# Patient Record
Sex: Female | Born: 1935
Health system: Southern US, Community
[De-identification: ages and names within clinical notes are randomized; demographics above are authoritative.]

## PROBLEM LIST (undated history)

## (undated) DIAGNOSIS — I4891 Unspecified atrial fibrillation: Secondary | ICD-10-CM

## (undated) DIAGNOSIS — I499 Cardiac arrhythmia, unspecified: Secondary | ICD-10-CM

## (undated) HISTORY — DX: Unspecified atrial fibrillation: I48.91

## (undated) HISTORY — DX: Cardiac arrhythmia, unspecified: I49.9

## (undated) HISTORY — PX: VARICOSE VEIN SURGERY: SHX832

## (undated) HISTORY — PX: ABDOMINAL HYSTERECTOMY: SHX81

---

## 1998-03-20 ENCOUNTER — Ambulatory Visit (HOSPITAL_BASED_OUTPATIENT_CLINIC_OR_DEPARTMENT_OTHER): Admission: RE | Admit: 1998-03-20 | Discharge: 1998-03-20 | Payer: Self-pay | Admitting: *Deleted

## 2015-03-22 ENCOUNTER — Other Ambulatory Visit: Payer: Self-pay | Admitting: Internal Medicine

## 2015-03-22 DIAGNOSIS — Z1231 Encounter for screening mammogram for malignant neoplasm of breast: Secondary | ICD-10-CM

## 2015-10-02 ENCOUNTER — Ambulatory Visit: Payer: Self-pay

## 2015-11-01 DIAGNOSIS — Z7901 Long term (current) use of anticoagulants: Secondary | ICD-10-CM | POA: Diagnosis not present

## 2015-11-30 DIAGNOSIS — J0100 Acute maxillary sinusitis, unspecified: Secondary | ICD-10-CM | POA: Diagnosis not present

## 2015-12-03 DIAGNOSIS — K2100 Gastro-esophageal reflux disease with esophagitis, without bleeding: Secondary | ICD-10-CM

## 2015-12-03 DIAGNOSIS — E7800 Pure hypercholesterolemia, unspecified: Secondary | ICD-10-CM

## 2015-12-03 DIAGNOSIS — M150 Primary generalized (osteo)arthritis: Secondary | ICD-10-CM

## 2015-12-03 DIAGNOSIS — M159 Polyosteoarthritis, unspecified: Secondary | ICD-10-CM

## 2015-12-03 DIAGNOSIS — J3089 Other allergic rhinitis: Secondary | ICD-10-CM | POA: Insufficient documentation

## 2015-12-03 DIAGNOSIS — I10 Essential (primary) hypertension: Secondary | ICD-10-CM | POA: Insufficient documentation

## 2015-12-03 DIAGNOSIS — Z7901 Long term (current) use of anticoagulants: Secondary | ICD-10-CM | POA: Insufficient documentation

## 2015-12-03 DIAGNOSIS — E039 Hypothyroidism, unspecified: Secondary | ICD-10-CM

## 2015-12-03 DIAGNOSIS — M810 Age-related osteoporosis without current pathological fracture: Secondary | ICD-10-CM

## 2015-12-03 DIAGNOSIS — I480 Paroxysmal atrial fibrillation: Secondary | ICD-10-CM | POA: Insufficient documentation

## 2015-12-03 DIAGNOSIS — M8589 Other specified disorders of bone density and structure, multiple sites: Secondary | ICD-10-CM

## 2015-12-03 DIAGNOSIS — M8949 Other hypertrophic osteoarthropathy, multiple sites: Secondary | ICD-10-CM

## 2015-12-03 HISTORY — DX: Gastro-esophageal reflux disease with esophagitis, without bleeding: K21.00

## 2015-12-03 HISTORY — DX: Essential (primary) hypertension: I10

## 2015-12-03 HISTORY — DX: Other hypertrophic osteoarthropathy, multiple sites: M89.49

## 2015-12-03 HISTORY — DX: Polyosteoarthritis, unspecified: M15.9

## 2015-12-03 HISTORY — DX: Age-related osteoporosis without current pathological fracture: M81.0

## 2015-12-03 HISTORY — DX: Pure hypercholesterolemia, unspecified: E78.00

## 2015-12-03 HISTORY — DX: Hypothyroidism, unspecified: E03.9

## 2015-12-03 HISTORY — DX: Paroxysmal atrial fibrillation: I48.0

## 2015-12-03 HISTORY — DX: Long term (current) use of anticoagulants: Z79.01

## 2015-12-03 HISTORY — DX: Other allergic rhinitis: J30.89

## 2015-12-03 HISTORY — DX: Other specified disorders of bone density and structure, multiple sites: M85.89

## 2015-12-03 HISTORY — DX: Primary generalized (osteo)arthritis: M15.0

## 2015-12-11 DIAGNOSIS — Z7901 Long term (current) use of anticoagulants: Secondary | ICD-10-CM | POA: Diagnosis not present

## 2016-01-01 DIAGNOSIS — I480 Paroxysmal atrial fibrillation: Secondary | ICD-10-CM | POA: Diagnosis not present

## 2016-01-01 DIAGNOSIS — N390 Urinary tract infection, site not specified: Secondary | ICD-10-CM | POA: Diagnosis not present

## 2016-01-01 DIAGNOSIS — I10 Essential (primary) hypertension: Secondary | ICD-10-CM | POA: Diagnosis not present

## 2016-01-10 DIAGNOSIS — Z7901 Long term (current) use of anticoagulants: Secondary | ICD-10-CM | POA: Diagnosis not present

## 2016-02-12 DIAGNOSIS — Z7901 Long term (current) use of anticoagulants: Secondary | ICD-10-CM | POA: Diagnosis not present

## 2016-03-19 DIAGNOSIS — I480 Paroxysmal atrial fibrillation: Secondary | ICD-10-CM | POA: Diagnosis not present

## 2016-03-19 DIAGNOSIS — Z5181 Encounter for therapeutic drug level monitoring: Secondary | ICD-10-CM | POA: Diagnosis not present

## 2016-03-19 DIAGNOSIS — E7800 Pure hypercholesterolemia, unspecified: Secondary | ICD-10-CM | POA: Diagnosis not present

## 2016-03-19 DIAGNOSIS — J3089 Other allergic rhinitis: Secondary | ICD-10-CM | POA: Diagnosis not present

## 2016-03-19 DIAGNOSIS — I10 Essential (primary) hypertension: Secondary | ICD-10-CM | POA: Diagnosis not present

## 2016-03-19 DIAGNOSIS — K210 Gastro-esophageal reflux disease with esophagitis: Secondary | ICD-10-CM | POA: Diagnosis not present

## 2016-03-19 DIAGNOSIS — R0600 Dyspnea, unspecified: Secondary | ICD-10-CM

## 2016-03-19 DIAGNOSIS — E039 Hypothyroidism, unspecified: Secondary | ICD-10-CM | POA: Diagnosis not present

## 2016-03-19 DIAGNOSIS — R0609 Other forms of dyspnea: Secondary | ICD-10-CM

## 2016-03-19 DIAGNOSIS — Z0000 Encounter for general adult medical examination without abnormal findings: Secondary | ICD-10-CM | POA: Diagnosis not present

## 2016-03-19 DIAGNOSIS — M8589 Other specified disorders of bone density and structure, multiple sites: Secondary | ICD-10-CM | POA: Diagnosis not present

## 2016-03-19 DIAGNOSIS — N3941 Urge incontinence: Secondary | ICD-10-CM

## 2016-03-19 DIAGNOSIS — Z7901 Long term (current) use of anticoagulants: Secondary | ICD-10-CM | POA: Diagnosis not present

## 2016-03-19 DIAGNOSIS — M150 Primary generalized (osteo)arthritis: Secondary | ICD-10-CM | POA: Diagnosis not present

## 2016-03-19 HISTORY — DX: Dyspnea, unspecified: R06.00

## 2016-03-19 HISTORY — DX: Other forms of dyspnea: R06.09

## 2016-03-19 HISTORY — DX: Urge incontinence: N39.41

## 2016-03-24 DIAGNOSIS — Z1231 Encounter for screening mammogram for malignant neoplasm of breast: Secondary | ICD-10-CM | POA: Diagnosis not present

## 2016-03-29 DIAGNOSIS — R0609 Other forms of dyspnea: Secondary | ICD-10-CM | POA: Diagnosis not present

## 2016-03-29 DIAGNOSIS — R0602 Shortness of breath: Secondary | ICD-10-CM | POA: Diagnosis not present

## 2016-04-21 DIAGNOSIS — I480 Paroxysmal atrial fibrillation: Secondary | ICD-10-CM | POA: Diagnosis not present

## 2016-04-21 DIAGNOSIS — I10 Essential (primary) hypertension: Secondary | ICD-10-CM | POA: Diagnosis not present

## 2016-04-21 DIAGNOSIS — R0609 Other forms of dyspnea: Secondary | ICD-10-CM | POA: Diagnosis not present

## 2016-04-21 DIAGNOSIS — Z7901 Long term (current) use of anticoagulants: Secondary | ICD-10-CM | POA: Diagnosis not present

## 2016-04-23 DIAGNOSIS — I10 Essential (primary) hypertension: Secondary | ICD-10-CM | POA: Diagnosis not present

## 2016-04-23 DIAGNOSIS — R0609 Other forms of dyspnea: Secondary | ICD-10-CM | POA: Diagnosis not present

## 2016-04-23 DIAGNOSIS — I480 Paroxysmal atrial fibrillation: Secondary | ICD-10-CM | POA: Diagnosis not present

## 2016-05-28 DIAGNOSIS — I10 Essential (primary) hypertension: Secondary | ICD-10-CM | POA: Diagnosis not present

## 2016-05-28 DIAGNOSIS — R0609 Other forms of dyspnea: Secondary | ICD-10-CM | POA: Diagnosis not present

## 2016-05-28 DIAGNOSIS — I480 Paroxysmal atrial fibrillation: Secondary | ICD-10-CM | POA: Diagnosis not present

## 2016-05-29 DIAGNOSIS — Z7901 Long term (current) use of anticoagulants: Secondary | ICD-10-CM | POA: Diagnosis not present

## 2016-06-07 DIAGNOSIS — I480 Paroxysmal atrial fibrillation: Secondary | ICD-10-CM | POA: Diagnosis not present

## 2016-06-07 DIAGNOSIS — R079 Chest pain, unspecified: Secondary | ICD-10-CM | POA: Diagnosis not present

## 2016-06-07 DIAGNOSIS — I4891 Unspecified atrial fibrillation: Secondary | ICD-10-CM | POA: Diagnosis not present

## 2016-06-10 DIAGNOSIS — I480 Paroxysmal atrial fibrillation: Secondary | ICD-10-CM | POA: Insufficient documentation

## 2016-06-10 DIAGNOSIS — E039 Hypothyroidism, unspecified: Secondary | ICD-10-CM | POA: Insufficient documentation

## 2016-06-12 DIAGNOSIS — Z87891 Personal history of nicotine dependence: Secondary | ICD-10-CM | POA: Diagnosis not present

## 2016-06-12 DIAGNOSIS — Z79899 Other long term (current) drug therapy: Secondary | ICD-10-CM | POA: Diagnosis not present

## 2016-06-17 DIAGNOSIS — I480 Paroxysmal atrial fibrillation: Secondary | ICD-10-CM | POA: Diagnosis not present

## 2016-06-17 DIAGNOSIS — E039 Hypothyroidism, unspecified: Secondary | ICD-10-CM | POA: Diagnosis not present

## 2016-06-17 DIAGNOSIS — I4891 Unspecified atrial fibrillation: Secondary | ICD-10-CM | POA: Diagnosis not present

## 2016-06-18 DIAGNOSIS — I480 Paroxysmal atrial fibrillation: Secondary | ICD-10-CM | POA: Diagnosis not present

## 2016-06-18 DIAGNOSIS — Z7901 Long term (current) use of anticoagulants: Secondary | ICD-10-CM | POA: Diagnosis not present

## 2016-06-18 DIAGNOSIS — N390 Urinary tract infection, site not specified: Secondary | ICD-10-CM | POA: Diagnosis not present

## 2016-06-18 DIAGNOSIS — E039 Hypothyroidism, unspecified: Secondary | ICD-10-CM | POA: Diagnosis not present

## 2016-06-26 DIAGNOSIS — R002 Palpitations: Secondary | ICD-10-CM | POA: Diagnosis not present

## 2016-06-26 DIAGNOSIS — R079 Chest pain, unspecified: Secondary | ICD-10-CM | POA: Diagnosis not present

## 2016-06-26 DIAGNOSIS — I499 Cardiac arrhythmia, unspecified: Secondary | ICD-10-CM | POA: Diagnosis not present

## 2016-06-26 DIAGNOSIS — I480 Paroxysmal atrial fibrillation: Secondary | ICD-10-CM | POA: Diagnosis not present

## 2016-07-07 DIAGNOSIS — I480 Paroxysmal atrial fibrillation: Secondary | ICD-10-CM | POA: Diagnosis not present

## 2016-07-07 DIAGNOSIS — E039 Hypothyroidism, unspecified: Secondary | ICD-10-CM | POA: Diagnosis not present

## 2016-07-08 DIAGNOSIS — R300 Dysuria: Secondary | ICD-10-CM | POA: Diagnosis not present

## 2016-07-08 DIAGNOSIS — I480 Paroxysmal atrial fibrillation: Secondary | ICD-10-CM | POA: Diagnosis not present

## 2016-07-08 DIAGNOSIS — I10 Essential (primary) hypertension: Secondary | ICD-10-CM | POA: Diagnosis not present

## 2016-07-09 DIAGNOSIS — R300 Dysuria: Secondary | ICD-10-CM | POA: Diagnosis not present

## 2016-08-07 DIAGNOSIS — E039 Hypothyroidism, unspecified: Secondary | ICD-10-CM | POA: Diagnosis not present

## 2016-08-07 DIAGNOSIS — I480 Paroxysmal atrial fibrillation: Secondary | ICD-10-CM | POA: Diagnosis not present

## 2016-08-12 DIAGNOSIS — C44311 Basal cell carcinoma of skin of nose: Secondary | ICD-10-CM | POA: Diagnosis not present

## 2016-08-12 DIAGNOSIS — D485 Neoplasm of uncertain behavior of skin: Secondary | ICD-10-CM | POA: Diagnosis not present

## 2016-08-12 DIAGNOSIS — L570 Actinic keratosis: Secondary | ICD-10-CM | POA: Diagnosis not present

## 2016-08-20 DIAGNOSIS — I480 Paroxysmal atrial fibrillation: Secondary | ICD-10-CM | POA: Diagnosis not present

## 2016-09-24 DIAGNOSIS — C44321 Squamous cell carcinoma of skin of nose: Secondary | ICD-10-CM | POA: Diagnosis not present

## 2016-10-02 DIAGNOSIS — L659 Nonscarring hair loss, unspecified: Secondary | ICD-10-CM

## 2016-10-02 DIAGNOSIS — E039 Hypothyroidism, unspecified: Secondary | ICD-10-CM | POA: Diagnosis not present

## 2016-10-02 DIAGNOSIS — I10 Essential (primary) hypertension: Secondary | ICD-10-CM | POA: Diagnosis not present

## 2016-10-02 DIAGNOSIS — R600 Localized edema: Secondary | ICD-10-CM | POA: Insufficient documentation

## 2016-10-02 DIAGNOSIS — Z7901 Long term (current) use of anticoagulants: Secondary | ICD-10-CM | POA: Diagnosis not present

## 2016-10-02 DIAGNOSIS — I480 Paroxysmal atrial fibrillation: Secondary | ICD-10-CM | POA: Diagnosis not present

## 2016-10-02 HISTORY — DX: Localized edema: R60.0

## 2016-10-02 HISTORY — DX: Nonscarring hair loss, unspecified: L65.9

## 2016-10-27 DIAGNOSIS — H2513 Age-related nuclear cataract, bilateral: Secondary | ICD-10-CM | POA: Diagnosis not present

## 2016-11-12 DIAGNOSIS — H2513 Age-related nuclear cataract, bilateral: Secondary | ICD-10-CM | POA: Diagnosis not present

## 2016-12-09 DIAGNOSIS — E039 Hypothyroidism, unspecified: Secondary | ICD-10-CM | POA: Diagnosis not present

## 2016-12-11 DIAGNOSIS — Z01818 Encounter for other preprocedural examination: Secondary | ICD-10-CM | POA: Diagnosis not present

## 2016-12-17 DIAGNOSIS — H2512 Age-related nuclear cataract, left eye: Secondary | ICD-10-CM | POA: Diagnosis not present

## 2016-12-17 DIAGNOSIS — H2511 Age-related nuclear cataract, right eye: Secondary | ICD-10-CM | POA: Diagnosis not present

## 2016-12-24 DIAGNOSIS — H2512 Age-related nuclear cataract, left eye: Secondary | ICD-10-CM | POA: Diagnosis not present

## 2016-12-30 DIAGNOSIS — H04122 Dry eye syndrome of left lacrimal gland: Secondary | ICD-10-CM | POA: Diagnosis not present

## 2017-02-10 DIAGNOSIS — L578 Other skin changes due to chronic exposure to nonionizing radiation: Secondary | ICD-10-CM | POA: Diagnosis not present

## 2017-02-10 DIAGNOSIS — L821 Other seborrheic keratosis: Secondary | ICD-10-CM | POA: Diagnosis not present

## 2017-02-10 DIAGNOSIS — D485 Neoplasm of uncertain behavior of skin: Secondary | ICD-10-CM | POA: Diagnosis not present

## 2017-04-02 DIAGNOSIS — Z1231 Encounter for screening mammogram for malignant neoplasm of breast: Secondary | ICD-10-CM | POA: Diagnosis not present

## 2017-04-13 DIAGNOSIS — I480 Paroxysmal atrial fibrillation: Secondary | ICD-10-CM | POA: Diagnosis not present

## 2017-04-13 DIAGNOSIS — I10 Essential (primary) hypertension: Secondary | ICD-10-CM | POA: Diagnosis not present

## 2017-04-13 DIAGNOSIS — W57XXXA Bitten or stung by nonvenomous insect and other nonvenomous arthropods, initial encounter: Secondary | ICD-10-CM | POA: Diagnosis not present

## 2017-04-13 DIAGNOSIS — K210 Gastro-esophageal reflux disease with esophagitis: Secondary | ICD-10-CM | POA: Diagnosis not present

## 2017-04-13 DIAGNOSIS — Z7901 Long term (current) use of anticoagulants: Secondary | ICD-10-CM | POA: Diagnosis not present

## 2017-04-13 DIAGNOSIS — R928 Other abnormal and inconclusive findings on diagnostic imaging of breast: Secondary | ICD-10-CM | POA: Diagnosis not present

## 2017-04-13 DIAGNOSIS — E039 Hypothyroidism, unspecified: Secondary | ICD-10-CM | POA: Diagnosis not present

## 2017-04-20 DIAGNOSIS — R922 Inconclusive mammogram: Secondary | ICD-10-CM | POA: Diagnosis not present

## 2017-04-20 DIAGNOSIS — R928 Other abnormal and inconclusive findings on diagnostic imaging of breast: Secondary | ICD-10-CM | POA: Diagnosis not present

## 2017-05-21 DIAGNOSIS — Z961 Presence of intraocular lens: Secondary | ICD-10-CM | POA: Diagnosis not present

## 2017-06-25 ENCOUNTER — Encounter: Payer: Self-pay | Admitting: Cardiology

## 2017-06-25 ENCOUNTER — Ambulatory Visit (INDEPENDENT_AMBULATORY_CARE_PROVIDER_SITE_OTHER): Payer: PPO | Admitting: Cardiology

## 2017-06-25 VITALS — BP 122/60 | HR 72 | Ht 63.0 in | Wt 141.0 lb

## 2017-06-25 DIAGNOSIS — I480 Paroxysmal atrial fibrillation: Secondary | ICD-10-CM | POA: Diagnosis not present

## 2017-06-25 DIAGNOSIS — E039 Hypothyroidism, unspecified: Secondary | ICD-10-CM | POA: Diagnosis not present

## 2017-06-25 DIAGNOSIS — I4891 Unspecified atrial fibrillation: Secondary | ICD-10-CM | POA: Diagnosis not present

## 2017-06-25 NOTE — Progress Notes (Signed)
Cardiology Office Note:    Date:  06/25/2017   ID:  Christine Baker, DOB Feb 04, 1935, MRN [id number]  PCP:  Christine Baker., MD  Cardiologist:  Christine Lindau, MD   Referring MD: No ref. provider found    ASSESSMENT:    1. PAF (paroxysmal atrial fibrillation) ([place])   2. Atrial fibrillation, unspecified type ([place])   3. Acquired hypothyroidism    PLAN:    In order of problems listed above:  1. I discussed with the patient atrial fibrillation, disease process. Management and therapy including rate and rhythm control, anticoagulation benefits and potential risks were discussed extensively with the patient. Patient had multiple questions which were answered to patient's satisfaction. 2. Patient will continue anticoagulation. In view of the fact that she is on amiodarone therapy 50 mg daily she will obtain pulmonary function tests. I will do blood work today including TSH. She tells me that he's been some time since this has been done. We'll send a copy to a primary care physician. She mentions to me that she gets like to evaluate her. 3. She will be seen in follow-up appointment in 6 months or earlier if she has any concerns.Importance of regular exercise stressed.   Medication Adjustments/Labs and Tests Ordered: Current medicines are reviewed at length with the patient today.  Concerns regarding medicines are outlined above.  Orders Placed This Encounter  Procedures  . Basic metabolic panel  . CBC with Differential/Platelet  . TSH  . Hepatic function panel  . Pulmonary function test   No orders of the defined types were placed in this encounter.    History of Present Illness:    Christine Baker is a 82 y.o. female who is being seen today for the evaluation of paroxysmal atrial fibrillation at the request of her primary care physician. Patient has history of approximately fibrillation and denies any problems at this time and takes care of activities of daily living. No chest pain  orthopnea or PND. She leads a sedentary lifestyle and does not exercise on a regular basis. She's not had any issues with palpitations. At the time of my evaluation she is alert awake oriented and in no distress.  Past Medical History:  Diagnosis Date  . A-fib ([place])   . Arrhythmia     History reviewed. No pertinent surgical history.  Current Medications: Current Meds  Medication Sig  . amiodarone (PACERONE) 100 MG tablet Take 50 mg by mouth daily.  . Cholecalciferol (VITAMIN D3) 1000 units CAPS Take 3,000 Units by mouth daily.  Marland Kitchen DIPHENHYDRAMINE-ACETAMINOPHEN PO Take 25 mg by mouth daily as needed.  Marland Kitchen levothyroxine (SYNTHROID, LEVOTHROID) 75 MCG tablet Take 75 mcg by mouth daily before breakfast.  . losartan-hydrochlorothiazide (HYZAAR) 50-12.5 MG tablet Take 0.5 tablets by mouth daily.  . metoprolol tartrate (LOPRESSOR) 25 MG tablet Take 12.5 mg by mouth 2 (two) times daily.  Marland Kitchen omeprazole (PRILOSEC) 20 MG capsule Take 20 mg by mouth daily.  . rivaroxaban (XARELTO) 20 MG TABS tablet Take 20 mg by mouth daily.     Allergies:   Bacitracin; Cefdinir; Erythromycin base; and Tetracycline   Social History   Social History  . Marital status: Married    Spouse name: N/A  . Number of children: N/A  . Years of education: N/A   Social History Main Topics  . Smoking status: Former Smoker    Quit date: 1970  . Smokeless tobacco: Never Used  . Alcohol use No  . Drug use: No  .  Sexual activity: Not Asked   Other Topics Concern  . None   Social History Narrative  . None     Family History: The patient's Family history is unknown by patient.  ROS:   Please see the history of present illness.    All other systems reviewed and are negative.  EKGs/Labs/Other Studies Reviewed:    The following studies were reviewed today: I reviewed records previous visits and also primary care physician's records including blood work.   Recent Labs: No results found for requested labs within  last 8760 hours.  Recent Lipid Panel No results found for: CHOL, TRIG, HDL, CHOLHDL, VLDL, LDLCALC, LDLDIRECT  Physical Exam:    VS:  BP 122/60   Pulse 72   Ht 5\' 3"  (1.6 m)   Wt 141 lb (64 kg)   SpO2 99%   BMI 24.98 kg/m     Wt Readings from Last 3 Encounters:  06/25/17 141 lb (64 kg)     GEN: Patient is in no acute distress HEENT: Normal NECK: No JVD; No carotid bruits LYMPHATICS: No lymphadenopathy CARDIAC: S1 S2 regular, 2/6 systolic murmur at the apex. RESPIRATORY:  Clear to auscultation without rales, wheezing or rhonchi  ABDOMEN: Soft, non-tender, non-distended MUSCULOSKELETAL:  No edema; No deformity  SKIN: Warm and dry NEUROLOGIC:  Alert and oriented x 3 PSYCHIATRIC:  Normal affect    Signed, Christine Lindau, MD  06/25/2017 11:09 AM    [place]

## 2017-06-25 NOTE — Patient Instructions (Signed)
Medication Instructions:   Your physician recommends that you continue on your current medications as directed. Please refer to the Current Medication list given to you today.   Labwork:  NONE  Testing/Procedures:  Your physician has recommended that you have a pulmonary function test. Pulmonary Function Tests are a group of tests that measure how well air moves in and out of your lungs.   Follow-Up  Your physician recommends that you schedule a follow-up appointment in: 6 months with Dr. Geraldo Pitter    Any Other Special Instructions Will Be Listed Below (If Applicable).     If you need a refill on your cardiac medications before your next appointment, please call your pharmacy.

## 2017-06-26 LAB — HEPATIC FUNCTION PANEL
ALBUMIN: 4.7 g/dL (ref 3.5–4.8)
ALT: 18 IU/L (ref 0–32)
AST: 24 IU/L (ref 0–40)
Alkaline Phosphatase: 81 IU/L (ref 39–117)
Bilirubin Total: 0.9 mg/dL (ref 0.0–1.2)
Bilirubin, Direct: 0.21 mg/dL (ref 0.00–0.40)
TOTAL PROTEIN: 7.2 g/dL (ref 6.0–8.5)

## 2017-06-26 LAB — BASIC METABOLIC PANEL
BUN/Creatinine Ratio: 20 (ref 12–28)
BUN: 17 mg/dL (ref 8–27)
CALCIUM: 10.4 mg/dL — AB (ref 8.7–10.3)
CHLORIDE: 102 mmol/L (ref 96–106)
CO2: 27 mmol/L (ref 20–29)
Creatinine, Ser: 0.85 mg/dL (ref 0.57–1.00)
GFR calc Af Amer: 75 mL/min/{1.73_m2} (ref >59)
GFR, EST NON AFRICAN AMERICAN: 65 mL/min/{1.73_m2} (ref >59)
GLUCOSE: 74 mg/dL (ref 65–99)
Potassium: 4.4 mmol/L (ref 3.5–5.2)
SODIUM: 142 mmol/L (ref 134–144)

## 2017-06-26 LAB — CBC WITH DIFFERENTIAL/PLATELET
Basophils Absolute: 0 10*3/uL (ref 0.0–0.2)
Basos: 0 %
EOS (ABSOLUTE): 0.1 10*3/uL (ref 0.0–0.4)
Eos: 3 %
HEMOGLOBIN: 13.5 g/dL (ref 11.1–15.9)
Hematocrit: 38.8 % (ref 34.0–46.6)
Immature Grans (Abs): 0 10*3/uL (ref 0.0–0.1)
Immature Granulocytes: 0 %
Lymphocytes Absolute: 1.1 10*3/uL (ref 0.7–3.1)
Lymphs: 27 %
MCH: 30.6 pg (ref 26.6–33.0)
MCHC: 34.8 g/dL (ref 31.5–35.7)
MCV: 88 fL (ref 79–97)
MONOCYTES: 20 %
Monocytes Absolute: 0.8 10*3/uL (ref 0.1–0.9)
NEUTROS ABS: 2.1 10*3/uL (ref 1.4–7.0)
Neutrophils: 50 %
Platelets: 177 10*3/uL (ref 150–379)
RBC: 4.41 x10E6/uL (ref 3.77–5.28)
RDW: 15.1 % (ref 12.3–15.4)
WBC: 4.2 10*3/uL (ref 3.4–10.8)

## 2017-06-26 LAB — TSH: TSH: 0.566 u[IU]/mL (ref 0.450–4.500)

## 2017-07-06 ENCOUNTER — Other Ambulatory Visit: Payer: Self-pay | Admitting: *Deleted

## 2017-07-06 MED ORDER — RIVAROXABAN 20 MG PO TABS: 20.0000 mg | ORAL_TABLET | Freq: Every day | ORAL | 5 refills | Status: DC

## 2017-07-07 ENCOUNTER — Other Ambulatory Visit: Payer: Self-pay

## 2017-07-07 DIAGNOSIS — I480 Paroxysmal atrial fibrillation: Secondary | ICD-10-CM

## 2017-07-07 MED ORDER — RIVAROXABAN 20 MG PO TABS: 20.0000 mg | ORAL_TABLET | Freq: Every day | ORAL | 11 refills | Status: DC

## 2017-07-20 ENCOUNTER — Other Ambulatory Visit: Payer: Self-pay

## 2017-07-20 ENCOUNTER — Telehealth: Payer: Self-pay

## 2017-07-20 DIAGNOSIS — I480 Paroxysmal atrial fibrillation: Secondary | ICD-10-CM

## 2017-07-20 MED ORDER — LOSARTAN POTASSIUM-HCTZ 50-12.5 MG PO TABS: 0.5000 | ORAL_TABLET | Freq: Every day | ORAL | 1 refills | Status: DC

## 2017-07-20 NOTE — Telephone Encounter (Signed)
Pt called stating that she is in the donut hole and is having trouble affording her Xarelto and would like to know if there is another medication that she could take that was not as expensive but would still be as effective. I told her I would ask the Dr. his recommendations. I also offered her some samples and educated her on the patient assistants programs. Pt states that she would try and see if she is eligible. All of the forms and samples will be left at the front desk waiting for pick up.

## 2017-07-24 ENCOUNTER — Other Ambulatory Visit: Payer: Self-pay

## 2017-07-24 ENCOUNTER — Telehealth: Payer: Self-pay | Admitting: Cardiology

## 2017-07-24 DIAGNOSIS — I480 Paroxysmal atrial fibrillation: Secondary | ICD-10-CM

## 2017-07-24 MED ORDER — LOSARTAN POTASSIUM-HCTZ 50-12.5 MG PO TABS: 0.5000 | ORAL_TABLET | Freq: Every day | ORAL | 3 refills | Status: DC

## 2017-07-24 MED ORDER — LOSARTAN POTASSIUM-HCTZ 50-12.5 MG PO TABS: 0.5000 | ORAL_TABLET | Freq: Every day | ORAL | 1 refills | Status: DC

## 2017-07-24 NOTE — Telephone Encounter (Signed)
Call [facility] to [facility] on [address] in [place]

## 2017-07-30 ENCOUNTER — Telehealth: Payer: Self-pay | Admitting: Cardiology

## 2017-07-30 NOTE — Telephone Encounter (Signed)
Wants to change from Xarelto to generic Plavix

## 2017-07-30 NOTE — Telephone Encounter (Signed)
Please advise 

## 2017-08-03 ENCOUNTER — Telehealth: Payer: Self-pay

## 2017-08-03 NOTE — Telephone Encounter (Signed)
Spoke with patient regarding her concerns regarding xarelto. Patient shared that she could not afford this medication and that she was denied for patient assistance as she makes to much money. Per Dr. Clearence Ped she is to follow up with her PCP for Coumadin as this is her only option. It was explained to the patient that this is to be done through the PCP as it requires frequent checks and it would be an inconvenience for her to come to [facility].

## 2017-08-14 DIAGNOSIS — I10 Essential (primary) hypertension: Secondary | ICD-10-CM | POA: Diagnosis not present

## 2017-08-14 DIAGNOSIS — Z7901 Long term (current) use of anticoagulants: Secondary | ICD-10-CM | POA: Diagnosis not present

## 2017-08-14 DIAGNOSIS — E039 Hypothyroidism, unspecified: Secondary | ICD-10-CM | POA: Diagnosis not present

## 2017-08-14 DIAGNOSIS — I480 Paroxysmal atrial fibrillation: Secondary | ICD-10-CM | POA: Diagnosis not present

## 2017-08-24 ENCOUNTER — Other Ambulatory Visit: Payer: Self-pay

## 2017-08-24 MED ORDER — METOPROLOL TARTRATE 25 MG PO TABS
12.5000 mg | ORAL_TABLET | Freq: Two times a day (BID) | ORAL | 3 refills | Status: DC
Start: 2017-08-24 — End: 2018-06-24

## 2017-08-25 ENCOUNTER — Other Ambulatory Visit: Payer: Self-pay

## 2017-08-25 MED ORDER — AMIODARONE HCL 100 MG PO TABS: 50.0000 mg | ORAL_TABLET | Freq: Every day | ORAL | 1 refills | Status: DC

## 2017-10-19 ENCOUNTER — Other Ambulatory Visit: Payer: Self-pay

## 2017-10-21 DIAGNOSIS — F5101 Primary insomnia: Secondary | ICD-10-CM | POA: Insufficient documentation

## 2017-10-21 DIAGNOSIS — Z79899 Other long term (current) drug therapy: Secondary | ICD-10-CM | POA: Insufficient documentation

## 2017-10-21 DIAGNOSIS — M150 Primary generalized (osteo)arthritis: Secondary | ICD-10-CM | POA: Diagnosis not present

## 2017-10-21 DIAGNOSIS — I10 Essential (primary) hypertension: Secondary | ICD-10-CM | POA: Diagnosis not present

## 2017-10-21 DIAGNOSIS — Z0000 Encounter for general adult medical examination without abnormal findings: Secondary | ICD-10-CM | POA: Diagnosis not present

## 2017-10-21 DIAGNOSIS — K210 Gastro-esophageal reflux disease with esophagitis: Secondary | ICD-10-CM | POA: Diagnosis not present

## 2017-10-21 DIAGNOSIS — E039 Hypothyroidism, unspecified: Secondary | ICD-10-CM | POA: Diagnosis not present

## 2017-10-21 DIAGNOSIS — Z23 Encounter for immunization: Secondary | ICD-10-CM | POA: Diagnosis not present

## 2017-10-21 DIAGNOSIS — F419 Anxiety disorder, unspecified: Secondary | ICD-10-CM | POA: Insufficient documentation

## 2017-10-21 DIAGNOSIS — I480 Paroxysmal atrial fibrillation: Secondary | ICD-10-CM | POA: Diagnosis not present

## 2017-10-21 DIAGNOSIS — E7800 Pure hypercholesterolemia, unspecified: Secondary | ICD-10-CM | POA: Diagnosis not present

## 2017-10-21 DIAGNOSIS — R300 Dysuria: Secondary | ICD-10-CM | POA: Diagnosis not present

## 2017-10-21 HISTORY — DX: Other long term (current) drug therapy: Z79.899

## 2017-10-21 HISTORY — DX: Anxiety disorder, unspecified: F41.9

## 2017-10-21 HISTORY — DX: Primary insomnia: F51.01

## 2017-10-29 DIAGNOSIS — Z961 Presence of intraocular lens: Secondary | ICD-10-CM | POA: Diagnosis not present

## 2017-10-29 DIAGNOSIS — H04122 Dry eye syndrome of left lacrimal gland: Secondary | ICD-10-CM | POA: Diagnosis not present

## 2017-10-29 DIAGNOSIS — H26493 Other secondary cataract, bilateral: Secondary | ICD-10-CM | POA: Diagnosis not present

## 2017-11-11 DIAGNOSIS — H26492 Other secondary cataract, left eye: Secondary | ICD-10-CM | POA: Diagnosis not present

## 2017-11-18 DIAGNOSIS — H26491 Other secondary cataract, right eye: Secondary | ICD-10-CM | POA: Diagnosis not present

## 2017-12-15 DIAGNOSIS — R21 Rash and other nonspecific skin eruption: Secondary | ICD-10-CM

## 2017-12-15 DIAGNOSIS — R220 Localized swelling, mass and lump, head: Secondary | ICD-10-CM

## 2017-12-15 DIAGNOSIS — I480 Paroxysmal atrial fibrillation: Secondary | ICD-10-CM | POA: Diagnosis not present

## 2017-12-15 DIAGNOSIS — Z79899 Other long term (current) drug therapy: Secondary | ICD-10-CM | POA: Diagnosis not present

## 2017-12-15 HISTORY — DX: Rash and other nonspecific skin eruption: R21

## 2017-12-15 HISTORY — DX: Localized swelling, mass and lump, head: R22.0

## 2017-12-22 ENCOUNTER — Ambulatory Visit (HOSPITAL_BASED_OUTPATIENT_CLINIC_OR_DEPARTMENT_OTHER)
Admission: RE | Admit: 2017-12-22 | Discharge: 2017-12-22 | Disposition: A | Discharge status: Home / Self Care | Payer: PPO | Source: Ambulatory Visit | Attending: Cardiology | Admitting: Cardiology

## 2017-12-22 ENCOUNTER — Ambulatory Visit (INDEPENDENT_AMBULATORY_CARE_PROVIDER_SITE_OTHER): Payer: PPO | Admitting: Cardiology

## 2017-12-22 ENCOUNTER — Encounter: Payer: Self-pay | Admitting: Cardiology

## 2017-12-22 ENCOUNTER — Telehealth: Payer: Self-pay | Admitting: Internal Medicine

## 2017-12-22 VITALS — BP 120/80 | HR 62 | Ht 63.0 in | Wt 138.8 lb

## 2017-12-22 DIAGNOSIS — Z79899 Other long term (current) drug therapy: Secondary | ICD-10-CM | POA: Insufficient documentation

## 2017-12-22 DIAGNOSIS — I10 Essential (primary) hypertension: Secondary | ICD-10-CM

## 2017-12-22 DIAGNOSIS — J449 Chronic obstructive pulmonary disease, unspecified: Secondary | ICD-10-CM | POA: Insufficient documentation

## 2017-12-22 DIAGNOSIS — I480 Paroxysmal atrial fibrillation: Secondary | ICD-10-CM | POA: Diagnosis not present

## 2017-12-22 DIAGNOSIS — E7800 Pure hypercholesterolemia, unspecified: Secondary | ICD-10-CM

## 2017-12-22 DIAGNOSIS — R0609 Other forms of dyspnea: Principal | ICD-10-CM

## 2017-12-22 NOTE — Patient Instructions (Signed)
Medication Instructions:  Your physician recommends that you continue on your current medications as directed. Please refer to the Current Medication list given to you today.  Labwork: Your physician recommends that you have the following labs drawn: BMP, CBC, TSH, and liver function  Testing/Procedures: Your physician has recommended that you have a pulmonary function test. Pulmonary Function Tests are a group of tests that measure how well air moves in and out of your lungs.  A chest x-ray takes a picture of the organs and structures inside the chest, including the heart, lungs, and blood vessels. This test can show several things, including, whether the heart is enlarges; whether fluid is building up in the lungs; and whether pacemaker / defibrillator leads are still in place.  Follow-Up: Your physician recommends that you schedule a follow-up appointment in: 6 months  Any Other Special Instructions Will Be Listed Below (If Applicable).     If you need a refill on your cardiac medications before your next appointment, please call your pharmacy.   [place], RN, BSN

## 2017-12-22 NOTE — Addendum Note (Signed)
Addended by: Mattie Marlin on: 12/22/2017 11:54 AM   Modules accepted: Orders

## 2017-12-22 NOTE — Addendum Note (Signed)
Addended by: Mattie Marlin on: 12/22/2017 09:30 AM   Modules accepted: Orders

## 2017-12-22 NOTE — Addendum Note (Signed)
Addended by: Mattie Marlin on: 12/22/2017 09:26 AM   Modules accepted: Orders

## 2017-12-22 NOTE — Telephone Encounter (Addendum)
Caryl Pina, CMA with Dr. Geraldo Pitter in Cardiology at [facility] Location needs pt to have full PFT asap Called and spoke with patient this morning Scheduled full PFT for next Friday 01/01/2018 at [place] order for full PFT today Pt verbalized understanding

## 2017-12-22 NOTE — Addendum Note (Signed)
Addended by: Georjean Mode on: 12/22/2017 11:48 AM   Modules accepted: Orders

## 2017-12-22 NOTE — Progress Notes (Signed)
Cardiology Office Note:    Date:  12/22/2017   ID:  Christine Baker, DOB 07/28/1935, MRN [id number]  PCP:  Raina Mina., MD  Cardiologist:  Jenean Lindau, MD   Referring MD: Raina Mina., MD    ASSESSMENT:    1. Essential (primary) hypertension   2. PAF (paroxysmal atrial fibrillation) ([place])   3. High risk medication use   4. Pure hypercholesterolemia    PLAN:    In order of problems listed above:  1. Primary prevention stressed with patient.  Importance of compliance with diet and medications stressed and she vocalized understanding. 2. I discussed with the patient atrial fibrillation, disease process. Management and therapy including rate and rhythm control, anticoagulation benefits and potential risks were discussed extensively with the patient. Patient had multiple questions which were answered to patient's satisfaction. 3. Her lipids are followed by her primary care physician.  We will check her blood work today.  This will be predominantly because she is on amiodarone therapy I will get LFTs also.  I will check TSH too.  Also for the same reason she will have chest x-ray and pelvic function tests. 4. Patient will be seen in follow-up appointment in 6 months or earlier if the patient has any concerns    Medication Adjustments/Labs and Tests Ordered: Current medicines are reviewed at length with the patient today.  Concerns regarding medicines are outlined above.  No orders of the defined types were placed in this encounter.  No orders of the defined types were placed in this encounter.    Chief Complaint  Patient presents with  . Follow-up  . Atrial Fibrillation     History of Present Illness:    Christine Baker is a 83 y.o. female patient.  She has past medical history approximately fibrillation, essential hypertension and dyslipidemia.  She is a very active lady and exercises on a regular basis.  No chest pain orthopnea or PND.  At the time of my evaluation,  the patient is alert awake oriented and in no distress.  Patient mentions to me that she is not experiencing any palpitations or any such problems.  She is happy that she is feeling well and is a good quality of life.  Past Medical History:  Diagnosis Date  . A-fib ([place])   . Arrhythmia     History reviewed. No pertinent surgical history.  Current Medications: Current Meds  Medication Sig  . amiodarone (PACERONE) 100 MG tablet Take 0.5 tablets (50 mg total) daily by mouth.  . Cholecalciferol (VITAMIN D3) 1000 units CAPS Take 3,000 Units by mouth daily.  Marland Kitchen levothyroxine (SYNTHROID, LEVOTHROID) 75 MCG tablet Take 75 mcg by mouth daily before breakfast.  . losartan-hydrochlorothiazide (HYZAAR) 50-12.5 MG tablet Take 0.5 tablets by mouth daily.  . metoprolol tartrate (LOPRESSOR) 25 MG tablet Take 0.5 tablets (12.5 mg total) 2 (two) times daily by mouth.  Marland Kitchen omeprazole (PRILOSEC) 20 MG capsule Take 20 mg by mouth daily.  . predniSONE (DELTASONE) 10 MG tablet Use 3 for 3 days, then 2 for 3 days, then 1 for 3 days.  . rivaroxaban (XARELTO) 20 MG TABS tablet Take 1 tablet (20 mg total) by mouth daily.     Allergies:   Bacitracin; Cefdinir; Tetracyclines & related; Erythromycin base; and Tetracycline   Social History   Socioeconomic History  . Marital status: Married    Spouse name: None  . Number of children: None  . Years of education: None  . Highest  education level: None  Social Needs  . Financial resource strain: None  . Food insecurity - worry: None  . Food insecurity - inability: None  . Transportation needs - medical: None  . Transportation needs - non-medical: None  Occupational History  . None  Tobacco Use  . Smoking status: Former Smoker    Last attempt to quit: 1970    Years since quitting: 49.2  . Smokeless tobacco: Never Used  Substance and Sexual Activity  . Alcohol use: No  . Drug use: No  . Sexual activity: None  Other Topics Concern  . None  Social History  Narrative  . None     Family History: The patient's Family history is unknown by patient.  ROS:   Please see the history of present illness.    All other systems reviewed and are negative.  EKGs/Labs/Other Studies Reviewed:    The following studies were reviewed today: I discussed my findings with the patient at extensive length.  Her blood pressure stable.   Recent Labs: 06/25/2017: ALT 18; BUN 17; Creatinine, Ser 0.85; Hemoglobin 13.5; Platelets 177; Potassium 4.4; Sodium 142; TSH 0.566  Recent Lipid Panel No results found for: CHOL, TRIG, HDL, CHOLHDL, VLDL, LDLCALC, LDLDIRECT  Physical Exam:    VS:  BP 120/80 (BP Location: Right Arm, Patient Position: Sitting, Cuff Size: Normal)   Pulse 62   Ht 5\' 3"  (1.6 m)   Wt 138 lb 12.8 oz (63 kg)   SpO2 98%   BMI 24.59 kg/m     Wt Readings from Last 3 Encounters:  12/22/17 138 lb 12.8 oz (63 kg)  06/25/17 141 lb (64 kg)     GEN: Patient is in no acute distress HEENT: Normal NECK: No JVD; No carotid bruits LYMPHATICS: No lymphadenopathy CARDIAC: Hear sounds regular, 2/6 systolic murmur at the apex. RESPIRATORY:  Clear to auscultation without rales, wheezing or rhonchi  ABDOMEN: Soft, non-tender, non-distended MUSCULOSKELETAL:  No edema; No deformity  SKIN: Warm and dry NEUROLOGIC:  Alert and oriented x 3 PSYCHIATRIC:  Normal affect   Signed, Jenean Lindau, MD  12/22/2017 9:22 AM     [facility]

## 2017-12-23 LAB — CBC WITH DIFFERENTIAL/PLATELET
BASOS ABS: 0 10*3/uL (ref 0.0–0.2)
Basos: 0 %
EOS (ABSOLUTE): 0 10*3/uL (ref 0.0–0.4)
Eos: 0 %
HEMOGLOBIN: 13.2 g/dL (ref 11.1–15.9)
Hematocrit: 39.7 % (ref 34.0–46.6)
IMMATURE GRANS (ABS): 0 10*3/uL (ref 0.0–0.1)
Immature Granulocytes: 0 %
LYMPHS ABS: 1.3 10*3/uL (ref 0.7–3.1)
LYMPHS: 17 %
MCH: 30 pg (ref 26.6–33.0)
MCHC: 33.2 g/dL (ref 31.5–35.7)
MCV: 90 fL (ref 79–97)
MONOCYTES: 7 %
Monocytes Absolute: 0.5 10*3/uL (ref 0.1–0.9)
Neutrophils Absolute: 5.6 10*3/uL (ref 1.4–7.0)
Neutrophils: 76 %
PLATELETS: 197 10*3/uL (ref 150–379)
RBC: 4.4 x10E6/uL (ref 3.77–5.28)
RDW: 14.4 % (ref 12.3–15.4)
WBC: 7.5 10*3/uL (ref 3.4–10.8)

## 2017-12-23 LAB — HEPATIC FUNCTION PANEL
ALT: 14 IU/L (ref 0–32)
AST: 13 IU/L (ref 0–40)
Albumin: 4.6 g/dL (ref 3.5–4.7)
Alkaline Phosphatase: 78 IU/L (ref 39–117)
BILIRUBIN, DIRECT: 0.35 mg/dL (ref 0.00–0.40)
Bilirubin Total: 1.6 mg/dL — ABNORMAL HIGH (ref 0.0–1.2)
Total Protein: 7.1 g/dL (ref 6.0–8.5)

## 2017-12-23 LAB — BASIC METABOLIC PANEL
BUN/Creatinine Ratio: 24 (ref 12–28)
BUN: 21 mg/dL (ref 8–27)
CHLORIDE: 100 mmol/L (ref 96–106)
CO2: 26 mmol/L (ref 20–29)
Calcium: 10.2 mg/dL (ref 8.7–10.3)
Creatinine, Ser: 0.89 mg/dL (ref 0.57–1.00)
GFR calc Af Amer: 71 mL/min/{1.73_m2} (ref >59)
GFR calc non Af Amer: 61 mL/min/{1.73_m2} (ref >59)
GLUCOSE: 94 mg/dL (ref 65–99)
POTASSIUM: 4.6 mmol/L (ref 3.5–5.2)
Sodium: 141 mmol/L (ref 134–144)

## 2017-12-23 LAB — TSH: TSH: 0.472 u[IU]/mL (ref 0.450–4.500)

## 2018-01-01 ENCOUNTER — Ambulatory Visit (INDEPENDENT_AMBULATORY_CARE_PROVIDER_SITE_OTHER): Payer: PPO | Admitting: Internal Medicine

## 2018-01-01 DIAGNOSIS — R0609 Other forms of dyspnea: Secondary | ICD-10-CM

## 2018-01-01 LAB — PULMONARY FUNCTION TEST
DL/VA % PRED: 82 %
DL/VA: 3.85 ml/min/mmHg/L
DLCO UNC: 15.72 ml/min/mmHg
DLCO unc % pred: 68 %
FEF 25-75 POST: 1.58 L/s
FEF 25-75 Pre: 1.23 L/sec
FEF2575-%Change-Post: 28 %
FEF2575-%PRED-POST: 117 %
FEF2575-%Pred-Pre: 91 %
FEV1-%CHANGE-POST: 5 %
FEV1-%PRED-PRE: 91 %
FEV1-%Pred-Post: 96 %
FEV1-POST: 1.77 L
FEV1-PRE: 1.68 L
FEV1FVC-%Change-Post: 4 %
FEV1FVC-%Pred-Pre: 99 %
FEV6-%Change-Post: 0 %
FEV6-%PRED-POST: 97 %
FEV6-%Pred-Pre: 97 %
FEV6-POST: 2.29 L
FEV6-PRE: 2.28 L
FEV6FVC-%CHANGE-POST: 0 %
FEV6FVC-%PRED-POST: 106 %
FEV6FVC-%PRED-PRE: 106 %
FVC-%Change-Post: 0 %
FVC-%PRED-POST: 92 %
FVC-%Pred-Pre: 91 %
FVC-POST: 2.29 L
FVC-PRE: 2.28 L
POST FEV1/FVC RATIO: 77 %
PRE FEV6/FVC RATIO: 100 %
Post FEV6/FVC ratio: 100 %
Pre FEV1/FVC ratio: 74 %
RV % pred: 97 %
RV: 2.29 L
TLC % pred: 92 %
TLC: 4.53 L

## 2018-01-01 NOTE — Progress Notes (Signed)
PFT completed today.  

## 2018-06-24 ENCOUNTER — Ambulatory Visit (INDEPENDENT_AMBULATORY_CARE_PROVIDER_SITE_OTHER): Payer: PPO | Admitting: Cardiology

## 2018-06-24 ENCOUNTER — Encounter: Payer: Self-pay | Admitting: Cardiology

## 2018-06-24 VITALS — BP 140/70 | HR 51 | Ht 63.0 in | Wt 134.0 lb

## 2018-06-24 DIAGNOSIS — I10 Essential (primary) hypertension: Secondary | ICD-10-CM | POA: Diagnosis not present

## 2018-06-24 DIAGNOSIS — I480 Paroxysmal atrial fibrillation: Secondary | ICD-10-CM | POA: Diagnosis not present

## 2018-06-24 NOTE — Patient Instructions (Signed)
Medication Instructions:  Your physician has recommended you make the following change in your medication:  STOP metoprolol  Labwork: Your physician recommends that you have the following labs drawn: BMP, CBC, TSH, liver and lipid today.  Testing/Procedures: None  Follow-Up: Your physician recommends that you schedule a follow-up appointment in: 6 months  Any Other Special Instructions Will Be Listed Below (If Applicable).     If you need a refill on your cardiac medications before your next appointment, please call your pharmacy.   [place], RN, BSN

## 2018-06-24 NOTE — Progress Notes (Signed)
Cardiology Office Note:    Date:  06/24/2018   ID:  HILA BOLDING, DOB 10/16/1934, MRN [id number]  PCP:  Raina Mina., MD  Cardiologist:  Jenean Lindau, MD   Referring MD: Raina Mina., MD    ASSESSMENT:    1. PAF (paroxysmal atrial fibrillation) ([place])   2. Essential (primary) hypertension    PLAN:    In order of problems listed above:  1. Primary prevention stressed to the patient.  Importance of compliance with diet and medication stressed and she vocalized understanding.  Her blood pressure is stable. 2. I discussed with the patient atrial fibrillation, disease process. Management and therapy including rate and rhythm control, anticoagulation benefits and potential risks were discussed extensively with the patient. Patient had multiple questions which were answered to patient's satisfaction. 3. Patient will be seen in follow-up appointment in 6 months or earlier if the patient has any concerns    Medication Adjustments/Labs and Tests Ordered: Current medicines are reviewed at length with the patient today.  Concerns regarding medicines are outlined above.  Orders Placed This Encounter  Procedures  . Basic metabolic panel  . CBC with Differential/Platelet  . TSH  . Hepatic function panel  . Lipid panel  . EKG 12-Lead   No orders of the defined types were placed in this encounter.    Chief Complaint  Patient presents with  . Follow-up     History of Present Illness:    Christine Baker is a 83 y.o. female.  The patient has past medical history of paroxysmal atrial fibrillation.  She denies any problems at this time and takes care of activities of daily living.  No chest pain orthopnea or PND.  She lives active lifestyle.  At the time of my evaluation, the patient is alert awake oriented and in no distress.  Past Medical History:  Diagnosis Date  . A-fib ([place])   . Arrhythmia     No past surgical history on file.  Current Medications: Current Meds    Medication Sig  . amiodarone (PACERONE) 100 MG tablet Take 0.5 tablets (50 mg total) daily by mouth.  . Biotin 1 MG CAPS Take by mouth daily.  . Cholecalciferol (VITAMIN D3) 1000 units CAPS Take 3,000 Units by mouth daily.  . diphenhydrAMINE (BENADRYL) 25 mg capsule Take 50 mg by mouth as needed.  Marland Kitchen levothyroxine (SYNTHROID, LEVOTHROID) 75 MCG tablet Take 75 mcg by mouth daily before breakfast.  . losartan-hydrochlorothiazide (HYZAAR) 50-12.5 MG tablet Take 0.5 tablets by mouth daily.  Marland Kitchen omeprazole (PRILOSEC) 20 MG capsule Take 20 mg by mouth daily.  . predniSONE (DELTASONE) 10 MG tablet Use 3 for 3 days, then 2 for 3 days, then 1 for 3 days.  . rivaroxaban (XARELTO) 20 MG TABS tablet Take 1 tablet (20 mg total) by mouth daily.  . [DISCONTINUED] metoprolol tartrate (LOPRESSOR) 25 MG tablet Take 0.5 tablets (12.5 mg total) 2 (two) times daily by mouth.     Allergies:   Bacitracin; Cefdinir; Tetracyclines & related; Erythromycin base; and Tetracycline   Social History   Socioeconomic History  . Marital status: Married    Spouse name: Not on file  . Number of children: Not on file  . Years of education: Not on file  . Highest education level: Not on file  Occupational History  . Not on file  Social Needs  . Financial resource strain: Not on file  . Food insecurity:    Worry: Not on file  Inability: Not on file  . Transportation needs:    Medical: Not on file    Non-medical: Not on file  Tobacco Use  . Smoking status: Former Smoker    Last attempt to quit: 1970    Years since quitting: 49.7  . Smokeless tobacco: Never Used  Substance and Sexual Activity  . Alcohol use: No  . Drug use: No  . Sexual activity: Not on file  Lifestyle  . Physical activity:    Days per week: Not on file    Minutes per session: Not on file  . Stress: Not on file  Relationships  . Social connections:    Talks on phone: Not on file    Gets together: Not on file    Attends religious service:  Not on file    Active member of club or organization: Not on file    Attends meetings of clubs or organizations: Not on file    Relationship status: Not on file  Other Topics Concern  . Not on file  Social History Narrative  . Not on file     Family History: The patient's Family history is unknown by patient.  ROS:   Please see the history of present illness.    All other systems reviewed and are negative.  EKGs/Labs/Other Studies Reviewed:    The following studies were reviewed today: EKG reveals sinus rhythm and nonspecific ST-T changes.   Recent Labs: 12/22/2017: ALT 14; BUN 21; Creatinine, Ser 0.89; Hemoglobin 13.2; Platelets 197; Potassium 4.6; Sodium 141; TSH 0.472  Recent Lipid Panel No results found for: CHOL, TRIG, HDL, CHOLHDL, VLDL, LDLCALC, LDLDIRECT  Physical Exam:    VS:  BP 140/70 (BP Location: Right Arm, Patient Position: Sitting, Cuff Size: Normal)   Pulse (!) 51   Ht 5\' 3"  (1.6 m)   Wt 134 lb (60.8 kg)   SpO2 98%   BMI 23.74 kg/m     Wt Readings from Last 3 Encounters:  06/24/18 134 lb (60.8 kg)  12/22/17 138 lb 12.8 oz (63 kg)  06/25/17 141 lb (64 kg)     GEN: Patient is in no acute distress HEENT: Normal NECK: No JVD; No carotid bruits LYMPHATICS: No lymphadenopathy CARDIAC: Hear sounds regular, 2/6 systolic murmur at the apex. RESPIRATORY:  Clear to auscultation without rales, wheezing or rhonchi  ABDOMEN: Soft, non-tender, non-distended MUSCULOSKELETAL:  No edema; No deformity  SKIN: Warm and dry NEUROLOGIC:  Alert and oriented x 3 PSYCHIATRIC:  Normal affect   Signed, Jenean Lindau, MD  06/24/2018 11:43 AM    [place]

## 2018-06-25 LAB — LIPID PANEL
CHOL/HDL RATIO: 4 ratio (ref 0.0–4.4)
Cholesterol, Total: 204 mg/dL — ABNORMAL HIGH (ref 100–199)
HDL: 51 mg/dL (ref >39)
LDL CALC: 124 mg/dL — AB (ref 0–99)
Triglycerides: 144 mg/dL (ref 0–149)
VLDL Cholesterol Cal: 29 mg/dL (ref 5–40)

## 2018-06-25 LAB — CBC WITH DIFFERENTIAL/PLATELET
BASOS: 1 %
Basophils Absolute: 0 10*3/uL (ref 0.0–0.2)
EOS (ABSOLUTE): 0.1 10*3/uL (ref 0.0–0.4)
Eos: 3 %
HEMATOCRIT: 40.9 % (ref 34.0–46.6)
HEMOGLOBIN: 13.9 g/dL (ref 11.1–15.9)
IMMATURE GRANULOCYTES: 0 %
Immature Grans (Abs): 0 10*3/uL (ref 0.0–0.1)
LYMPHS ABS: 1.4 10*3/uL (ref 0.7–3.1)
LYMPHS: 30 %
MCH: 30.2 pg (ref 26.6–33.0)
MCHC: 34 g/dL (ref 31.5–35.7)
MCV: 89 fL (ref 79–97)
Monocytes Absolute: 0.8 10*3/uL (ref 0.1–0.9)
Monocytes: 17 %
NEUTROS ABS: 2.3 10*3/uL (ref 1.4–7.0)
Neutrophils: 49 %
Platelets: 178 10*3/uL (ref 150–450)
RBC: 4.6 x10E6/uL (ref 3.77–5.28)
RDW: 13 % (ref 12.3–15.4)
WBC: 4.6 10*3/uL (ref 3.4–10.8)

## 2018-06-25 LAB — BASIC METABOLIC PANEL
BUN / CREAT RATIO: 15 (ref 12–28)
BUN: 12 mg/dL (ref 8–27)
CALCIUM: 10 mg/dL (ref 8.7–10.3)
CHLORIDE: 100 mmol/L (ref 96–106)
CO2: 25 mmol/L (ref 20–29)
Creatinine, Ser: 0.78 mg/dL (ref 0.57–1.00)
GFR calc Af Amer: 83 mL/min/{1.73_m2} (ref >59)
GFR calc non Af Amer: 72 mL/min/{1.73_m2} (ref >59)
Glucose: 90 mg/dL (ref 65–99)
POTASSIUM: 4.4 mmol/L (ref 3.5–5.2)
SODIUM: 139 mmol/L (ref 134–144)

## 2018-06-25 LAB — HEPATIC FUNCTION PANEL
ALBUMIN: 4.7 g/dL (ref 3.5–4.7)
ALK PHOS: 80 IU/L (ref 39–117)
ALT: 15 IU/L (ref 0–32)
AST: 22 IU/L (ref 0–40)
BILIRUBIN, DIRECT: 0.24 mg/dL (ref 0.00–0.40)
Bilirubin Total: 0.9 mg/dL (ref 0.0–1.2)
Total Protein: 6.9 g/dL (ref 6.0–8.5)

## 2018-06-25 LAB — TSH: TSH: 0.085 u[IU]/mL — ABNORMAL LOW (ref 0.450–4.500)

## 2018-06-29 ENCOUNTER — Telehealth: Payer: Self-pay | Admitting: Cardiology

## 2018-06-29 NOTE — Telephone Encounter (Signed)
Patient states that since doctor took her off metoprolol, she has not felt right. She is nervous and jittery, and the back of her head is tender.

## 2018-06-29 NOTE — Telephone Encounter (Signed)
Requested that the patient call her PCP as soon as we get off of the phone regarding her low TSH.

## 2018-07-01 DIAGNOSIS — E7800 Pure hypercholesterolemia, unspecified: Secondary | ICD-10-CM | POA: Diagnosis not present

## 2018-07-01 DIAGNOSIS — E039 Hypothyroidism, unspecified: Secondary | ICD-10-CM | POA: Diagnosis not present

## 2018-07-01 DIAGNOSIS — Z79899 Other long term (current) drug therapy: Secondary | ICD-10-CM | POA: Diagnosis not present

## 2018-07-01 DIAGNOSIS — I480 Paroxysmal atrial fibrillation: Secondary | ICD-10-CM | POA: Diagnosis not present

## 2018-07-01 DIAGNOSIS — I10 Essential (primary) hypertension: Secondary | ICD-10-CM | POA: Diagnosis not present

## 2018-07-08 DIAGNOSIS — E039 Hypothyroidism, unspecified: Secondary | ICD-10-CM | POA: Diagnosis not present

## 2018-07-12 ENCOUNTER — Other Ambulatory Visit: Payer: Self-pay

## 2018-07-12 ENCOUNTER — Telehealth: Payer: Self-pay | Admitting: Cardiology

## 2018-07-12 DIAGNOSIS — I480 Paroxysmal atrial fibrillation: Secondary | ICD-10-CM

## 2018-07-12 MED ORDER — LOSARTAN POTASSIUM-HCTZ 50-12.5 MG PO TABS: 0.5000 | ORAL_TABLET | Freq: Every day | ORAL | 1 refills | Status: DC

## 2018-07-12 NOTE — Telephone Encounter (Signed)
Is out of losartin and [facility] states they can't get it

## 2018-07-12 NOTE — Telephone Encounter (Signed)
Medication was sent to [facility] as they are out of this medication.

## 2018-07-13 ENCOUNTER — Telehealth: Payer: Self-pay | Admitting: Cardiology

## 2018-07-13 ENCOUNTER — Ambulatory Visit (INDEPENDENT_AMBULATORY_CARE_PROVIDER_SITE_OTHER): Payer: PPO | Admitting: Cardiology

## 2018-07-13 VITALS — BP 100/70 | HR 65 | Ht 63.0 in | Wt 134.0 lb

## 2018-07-13 DIAGNOSIS — I480 Paroxysmal atrial fibrillation: Secondary | ICD-10-CM

## 2018-07-13 MED ORDER — LOSARTAN POTASSIUM 50 MG PO TABS: 50.0000 mg | ORAL_TABLET | Freq: Every day | ORAL | 2 refills | Status: DC

## 2018-07-13 MED ORDER — METOPROLOL TARTRATE 25 MG PO TABS: 12.5000 mg | ORAL_TABLET | Freq: Two times a day (BID) | ORAL | 3 refills | Status: DC

## 2018-07-13 NOTE — Telephone Encounter (Signed)
Patient will come in this afternoon for nurse visit.

## 2018-07-13 NOTE — Telephone Encounter (Signed)
Her afib is acting up again-should she go to the ER?

## 2018-07-13 NOTE — Progress Notes (Signed)
Patient presented today for pulse, blood pressure and EKG check due to feeling as if she is going in and out of a-fib. EKG reflects NSR. Patient's BP is on the low side today. Per Dr. Geraldo Pitter patient is to stop combination drug and start losartan 50 mg. Patient is to also start metoprolol 12.5 mg twice daily. Patient will return in 1 week for BP, pulse and BMP in 1 week. Orders have been placed.

## 2018-07-13 NOTE — Telephone Encounter (Signed)
Metoprolol was stopped at last office visit due to heart rate of 51. Per the patient her blood pressure has been 413'S systolic.

## 2018-07-13 NOTE — Telephone Encounter (Signed)
Patient presented in the office due to her a-fib. Patient states she was on beta blocker that was previously stopped. Patient took half a tab this am and reports her heart rate calmed down.

## 2018-07-13 NOTE — Telephone Encounter (Signed)
Get the patient in for a pulse blood pressure and EKG check.

## 2018-07-13 NOTE — Patient Instructions (Addendum)
Medication Instructions:  Your physician has recommended you make the following change in your medication:   STOP losartan-HCTZ CHANGE losartan 50 mg daily START metoprolol 12.5 mg twice daily  If you need a refill on your cardiac medications before your next appointment, please call your pharmacy.   Lab work: None  If you have labs (blood work) drawn today and your tests are completely normal, you will receive your results only by: Marland Kitchen MyChart Message (if you have MyChart) OR . A paper copy in the mail If you have any lab test that is abnormal or we need to change your treatment, we will call you to review the results.  Testing/Procedures: None  Follow-Up: At [facility], you and your health needs are our priority.  As part of our continuing mission to provide you with exceptional heart care, we have created designated Provider Care Teams.  These Care Teams include your primary Cardiologist (physician) and Advanced Practice Providers (APPs -  Physician Assistants and Nurse Practitioners) who all work together to provide you with the care you need, when you need it.   Any Other Special Instructions Will Be Listed Below (If Applicable).  Please return to the office in 1 week for pulse, BP, and BMP

## 2018-07-20 ENCOUNTER — Ambulatory Visit: Payer: PPO

## 2018-07-21 ENCOUNTER — Ambulatory Visit: Payer: PPO

## 2018-07-22 ENCOUNTER — Ambulatory Visit (INDEPENDENT_AMBULATORY_CARE_PROVIDER_SITE_OTHER): Payer: PPO | Admitting: Cardiology

## 2018-07-22 VITALS — BP 150/64 | HR 62 | Ht 63.0 in | Wt 133.0 lb

## 2018-07-22 DIAGNOSIS — I10 Essential (primary) hypertension: Secondary | ICD-10-CM

## 2018-07-22 DIAGNOSIS — I480 Paroxysmal atrial fibrillation: Secondary | ICD-10-CM

## 2018-07-22 DIAGNOSIS — E7800 Pure hypercholesterolemia, unspecified: Secondary | ICD-10-CM

## 2018-07-22 LAB — BASIC METABOLIC PANEL
BUN/Creatinine Ratio: 16 (ref 12–28)
BUN: 13 mg/dL (ref 8–27)
CALCIUM: 9.7 mg/dL (ref 8.7–10.3)
CHLORIDE: 102 mmol/L (ref 96–106)
CO2: 24 mmol/L (ref 20–29)
Creatinine, Ser: 0.81 mg/dL (ref 0.57–1.00)
GFR calc Af Amer: 79 mL/min/{1.73_m2} (ref >59)
GFR calc non Af Amer: 69 mL/min/{1.73_m2} (ref >59)
Glucose: 84 mg/dL (ref 65–99)
POTASSIUM: 4 mmol/L (ref 3.5–5.2)
Sodium: 143 mmol/L (ref 134–144)

## 2018-07-22 NOTE — Progress Notes (Signed)
bmp 

## 2018-07-22 NOTE — Progress Notes (Signed)
Patient presented today for pulse, BMP and BP check due to med changes at last nurse visit. Patient does not feel that she has been going back into a-fib and states she feels much better now. Per Dr. Agustin Cree no changes to be made at this time. Patient is agreeable.

## 2018-09-09 DIAGNOSIS — R399 Unspecified symptoms and signs involving the genitourinary system: Secondary | ICD-10-CM | POA: Insufficient documentation

## 2018-09-09 DIAGNOSIS — Z23 Encounter for immunization: Secondary | ICD-10-CM | POA: Insufficient documentation

## 2018-09-09 DIAGNOSIS — R300 Dysuria: Secondary | ICD-10-CM

## 2018-09-09 DIAGNOSIS — E039 Hypothyroidism, unspecified: Secondary | ICD-10-CM | POA: Diagnosis not present

## 2018-09-09 HISTORY — DX: Encounter for immunization: Z23

## 2018-09-09 HISTORY — DX: Dysuria: R30.0

## 2018-09-09 HISTORY — DX: Unspecified symptoms and signs involving the genitourinary system: R39.9

## 2018-10-11 ENCOUNTER — Other Ambulatory Visit: Payer: Self-pay | Admitting: Cardiology

## 2018-10-20 DIAGNOSIS — J069 Acute upper respiratory infection, unspecified: Secondary | ICD-10-CM | POA: Diagnosis not present

## 2018-10-20 DIAGNOSIS — E039 Hypothyroidism, unspecified: Secondary | ICD-10-CM | POA: Diagnosis not present

## 2018-10-20 DIAGNOSIS — I480 Paroxysmal atrial fibrillation: Secondary | ICD-10-CM | POA: Diagnosis not present

## 2018-10-20 DIAGNOSIS — I10 Essential (primary) hypertension: Secondary | ICD-10-CM | POA: Diagnosis not present

## 2018-10-20 DIAGNOSIS — Z79899 Other long term (current) drug therapy: Secondary | ICD-10-CM | POA: Diagnosis not present

## 2018-10-22 DIAGNOSIS — D696 Thrombocytopenia, unspecified: Secondary | ICD-10-CM

## 2018-10-22 HISTORY — DX: Thrombocytopenia, unspecified: D69.6

## 2018-11-01 ENCOUNTER — Other Ambulatory Visit: Payer: Self-pay | Admitting: Cardiology

## 2018-11-01 DIAGNOSIS — I480 Paroxysmal atrial fibrillation: Secondary | ICD-10-CM

## 2018-12-21 ENCOUNTER — Ambulatory Visit (INDEPENDENT_AMBULATORY_CARE_PROVIDER_SITE_OTHER): Payer: PPO | Admitting: Cardiology

## 2018-12-21 ENCOUNTER — Other Ambulatory Visit: Payer: Self-pay

## 2018-12-21 ENCOUNTER — Encounter: Payer: Self-pay | Admitting: Cardiology

## 2018-12-21 VITALS — BP 116/58 | HR 56 | Ht 63.0 in | Wt 134.0 lb

## 2018-12-21 DIAGNOSIS — Z1329 Encounter for screening for other suspected endocrine disorder: Secondary | ICD-10-CM | POA: Diagnosis not present

## 2018-12-21 DIAGNOSIS — E7800 Pure hypercholesterolemia, unspecified: Secondary | ICD-10-CM | POA: Diagnosis not present

## 2018-12-21 DIAGNOSIS — I480 Paroxysmal atrial fibrillation: Secondary | ICD-10-CM | POA: Diagnosis not present

## 2018-12-21 DIAGNOSIS — I10 Essential (primary) hypertension: Secondary | ICD-10-CM

## 2018-12-21 LAB — BASIC METABOLIC PANEL
BUN/Creatinine Ratio: 18 (ref 12–28)
BUN: 17 mg/dL (ref 8–27)
CALCIUM: 10 mg/dL (ref 8.7–10.3)
CO2: 25 mmol/L (ref 20–29)
Chloride: 100 mmol/L (ref 96–106)
Creatinine, Ser: 0.93 mg/dL (ref 0.57–1.00)
GFR, EST AFRICAN AMERICAN: 67 mL/min/{1.73_m2} (ref >59)
GFR, EST NON AFRICAN AMERICAN: 58 mL/min/{1.73_m2} — AB (ref >59)
Glucose: 87 mg/dL (ref 65–99)
POTASSIUM: 4.3 mmol/L (ref 3.5–5.2)
Sodium: 140 mmol/L (ref 134–144)

## 2018-12-21 LAB — CBC
HEMATOCRIT: 40.8 % (ref 34.0–46.6)
Hemoglobin: 13.7 g/dL (ref 11.1–15.9)
MCH: 30.3 pg (ref 26.6–33.0)
MCHC: 33.6 g/dL (ref 31.5–35.7)
MCV: 90 fL (ref 79–97)
PLATELETS: 154 10*3/uL (ref 150–450)
RBC: 4.52 x10E6/uL (ref 3.77–5.28)
RDW: 13.3 % (ref 11.7–15.4)
WBC: 3.4 10*3/uL (ref 3.4–10.8)

## 2018-12-21 LAB — HEPATIC FUNCTION PANEL
ALT: 15 IU/L (ref 0–32)
AST: 21 IU/L (ref 0–40)
Albumin: 4.6 g/dL (ref 3.6–4.6)
Alkaline Phosphatase: 74 IU/L (ref 39–117)
BILIRUBIN TOTAL: 0.9 mg/dL (ref 0.0–1.2)
BILIRUBIN, DIRECT: 0.22 mg/dL (ref 0.00–0.40)
Total Protein: 6.8 g/dL (ref 6.0–8.5)

## 2018-12-21 LAB — LIPID PANEL
Chol/HDL Ratio: 3.3 ratio (ref 0.0–4.4)
Cholesterol, Total: 220 mg/dL — ABNORMAL HIGH (ref 100–199)
HDL: 66 mg/dL (ref >39)
LDL Calculated: 134 mg/dL — ABNORMAL HIGH (ref 0–99)
Triglycerides: 101 mg/dL (ref 0–149)
VLDL Cholesterol Cal: 20 mg/dL (ref 5–40)

## 2018-12-21 LAB — TSH: TSH: 1.46 u[IU]/mL (ref 0.450–4.500)

## 2018-12-21 NOTE — Progress Notes (Signed)
Cardiology Office Note:    Date:  12/21/2018   ID:  Christine Baker, DOB Nov 27, 1934, MRN [id number]  PCP:  Raina Mina., MD  Cardiologist:  Jenean Lindau, MD   Referring MD: Raina Mina., MD    ASSESSMENT:    1. Pure hypercholesterolemia   2. Essential (primary) hypertension   3. PAF (paroxysmal atrial fibrillation) ([place])   4. Thyroid disorder screening    PLAN:    In order of problems listed above:  1. Primary prevention stressed with the patient.  Importance of compliance with diet and medication stressed and she vocalized understanding.  Her blood pressure is stable.  Diet was discussed and the importance of regular exercise stressed. 2. She is fasting and will have blood work today including lipids 3. Patient will be seen in follow-up appointment in 6 months or earlier if the patient has any concerns 4. I discussed with the patient atrial fibrillation, disease process. Management and therapy including rate and rhythm control, anticoagulation benefits and potential risks were discussed extensively with the patient. Patient had multiple questions which were answered to patient's satisfaction.    Medication Adjustments/Labs and Tests Ordered: Current medicines are reviewed at length with the patient today.  Concerns regarding medicines are outlined above.  Orders Placed This Encounter  Procedures  . Lipid Profile  . CBC  . Hepatic function panel  . TSH  . Basic Metabolic Panel (BMET)   No orders of the defined types were placed in this encounter.    No chief complaint on file.    History of Present Illness:    Christine Baker is a 83 y.o. female.  Patient has paroxysmal atrial fibrillation and she denies any problems at this time and takes care of activities of daily living.  No chest pain orthopnea PND or shortness of breath.  [facility]  At the time of my evaluation, the patient is alert awake oriented and in no distress.  Past  Medical History:  Diagnosis Date  . A-fib ([place])   . Arrhythmia     History reviewed. No pertinent surgical history.  Current Medications: Current Meds  Medication Sig  . Cholecalciferol (VITAMIN D3) 1000 units CAPS Take 3,000 Units by mouth daily.  . diphenhydrAMINE (BENADRYL) 25 mg capsule Take 50 mg by mouth as needed.  Marland Kitchen levothyroxine (SYNTHROID, LEVOTHROID) 75 MCG tablet Take 75 mcg by mouth daily before breakfast.  . omeprazole (PRILOSEC) 20 MG capsule Take 20 mg by mouth daily.  Marland Kitchen PACERONE 100 MG tablet TAKE 1/2 (ONE-HALF) TABLET BY MOUTH ONCE DAILY  . XARELTO 20 MG TABS tablet TAKE 1 TABLET BY MOUTH ONCE DAILY     Allergies:   Bacitracin; Cefdinir; Tetracyclines & related; Erythromycin base; and Tetracycline   Social History   Socioeconomic History  . Marital status: Married    Spouse name: Not on file  . Number of children: Not on file  . Years of education: Not on file  . Highest education level: Not on file  Occupational History  . Not on file  Social Needs  . Financial resource strain: Not on file  . Food insecurity:    Worry: Not on file    Inability: Not on file  . Transportation needs:    Medical: Not on file    Non-medical: Not on file  Tobacco Use  . Smoking status: Former Smoker    Last attempt to quit: 1970    Years  since quitting: 50.2  . Smokeless tobacco: Never Used  Substance and Sexual Activity  . Alcohol use: No  . Drug use: No  . Sexual activity: Not on file  Lifestyle  . Physical activity:    Days per week: Not on file    Minutes per session: Not on file  . Stress: Not on file  Relationships  . Social connections:    Talks on phone: Not on file    Gets together: Not on file    Attends religious service: Not on file    Active member of club or organization: Not on file    Attends meetings of clubs or organizations: Not on file    Relationship status: Not on file  Other Topics Concern  . Not on file  Social History Narrative  .  Not on file     Family History: The patient's Family history is unknown by patient.  ROS:   Please see the history of present illness.    All other systems reviewed and are negative.  EKGs/Labs/Other Studies Reviewed:    The following studies were reviewed today: I discussed my findings with the patient at extensive length.  Chest x-ray report was discussed from the last visit.   Recent Labs: 06/24/2018: ALT 15; Hemoglobin 13.9; Platelets 178; TSH 0.085 07/22/2018: BUN 13; Creatinine, Ser 0.81; Potassium 4.0; Sodium 143  Recent Lipid Panel    Component Value Date/Time   CHOL 204 (H) 06/24/2018 1148   TRIG 144 06/24/2018 1148   HDL 51 06/24/2018 1148   CHOLHDL 4.0 06/24/2018 1148   LDLCALC 124 (H) 06/24/2018 1148    Physical Exam:    VS:  BP (!) 116/58 (BP Location: Right Arm, Patient Position: Sitting, Cuff Size: Normal)   Pulse (!) 56   Ht 5\' 3"  (1.6 m)   Wt 134 lb (60.8 kg)   SpO2 99%   BMI 23.74 kg/m     Wt Readings from Last 3 Encounters:  12/21/18 134 lb (60.8 kg)  07/22/18 133 lb (60.3 kg)  07/13/18 134 lb (60.8 kg)     GEN: Patient is in no acute distress HEENT: Normal NECK: No JVD; No carotid bruits LYMPHATICS: No lymphadenopathy CARDIAC: Hear sounds regular, 2/6 systolic murmur at the apex. RESPIRATORY:  Clear to auscultation without rales, wheezing or rhonchi  ABDOMEN: Soft, non-tender, non-distended MUSCULOSKELETAL:  No edema; No deformity  SKIN: Warm and dry NEUROLOGIC:  Alert and oriented x 3 PSYCHIATRIC:  Normal affect   Signed, Jenean Lindau, MD  12/21/2018 9:43 AM    [place]

## 2018-12-21 NOTE — Patient Instructions (Signed)
Medication Instructions:  Your physician recommends that you continue on your current medications as directed. Please refer to the Current Medication list given to you today.  If you need a refill on your cardiac medications before your next appointment, please call your pharmacy.   Lab work: Your physician recommends that you have a BMP,CBC,TSH,LFT and lipid panel drawn today.    If you have labs (blood work) drawn today and your tests are completely normal, you will receive your results only by: Marland Kitchen MyChart Message (if you have MyChart) OR . A paper copy in the mail If you have any lab test that is abnormal or we need to change your treatment, we will call you to review the results.  Testing/Procedures: NONE Follow-Up: At [facility], you and your health needs are our priority.  As part of our continuing mission to provide you with exceptional heart care, we have created designated Provider Care Teams.  These Care Teams include your primary Cardiologist (physician) and Advanced Practice Providers (APPs -  Physician Assistants and Nurse Practitioners) who all work together to provide you with the care you need, when you need it. You will need a follow up appointment in 6 months.

## 2018-12-28 ENCOUNTER — Telehealth: Payer: Self-pay

## 2018-12-28 NOTE — Telephone Encounter (Signed)
Results relayed to patient with emphasis on decreasing fatty foods and eating more whole grains and fiber. No further questions at this time.

## 2019-02-11 DIAGNOSIS — R300 Dysuria: Secondary | ICD-10-CM | POA: Diagnosis not present

## 2019-02-11 DIAGNOSIS — N390 Urinary tract infection, site not specified: Secondary | ICD-10-CM | POA: Diagnosis not present

## 2019-02-11 DIAGNOSIS — I10 Essential (primary) hypertension: Secondary | ICD-10-CM | POA: Diagnosis not present

## 2019-02-11 DIAGNOSIS — J3089 Other allergic rhinitis: Secondary | ICD-10-CM | POA: Diagnosis not present

## 2019-02-23 DIAGNOSIS — Z79899 Other long term (current) drug therapy: Secondary | ICD-10-CM | POA: Diagnosis not present

## 2019-02-23 DIAGNOSIS — Z23 Encounter for immunization: Secondary | ICD-10-CM | POA: Diagnosis not present

## 2019-02-23 DIAGNOSIS — R600 Localized edema: Secondary | ICD-10-CM | POA: Diagnosis not present

## 2019-02-23 DIAGNOSIS — D696 Thrombocytopenia, unspecified: Secondary | ICD-10-CM | POA: Diagnosis not present

## 2019-02-23 DIAGNOSIS — I10 Essential (primary) hypertension: Secondary | ICD-10-CM | POA: Diagnosis not present

## 2019-02-23 DIAGNOSIS — R609 Edema, unspecified: Secondary | ICD-10-CM | POA: Diagnosis not present

## 2019-02-23 DIAGNOSIS — Z0000 Encounter for general adult medical examination without abnormal findings: Secondary | ICD-10-CM | POA: Diagnosis not present

## 2019-02-23 DIAGNOSIS — E039 Hypothyroidism, unspecified: Secondary | ICD-10-CM | POA: Diagnosis not present

## 2019-02-23 DIAGNOSIS — R399 Unspecified symptoms and signs involving the genitourinary system: Secondary | ICD-10-CM | POA: Diagnosis not present

## 2019-02-23 DIAGNOSIS — I480 Paroxysmal atrial fibrillation: Secondary | ICD-10-CM | POA: Diagnosis not present

## 2019-03-13 IMAGING — CR DG CHEST 2V
2 series · 2 of 2 positions shown · non-contrast
Comparison: 06/26/2016

CLINICAL DATA: Amiodarone therapy for 2 years

EXAM:
CHEST - 2 VIEW

[w chest pa]
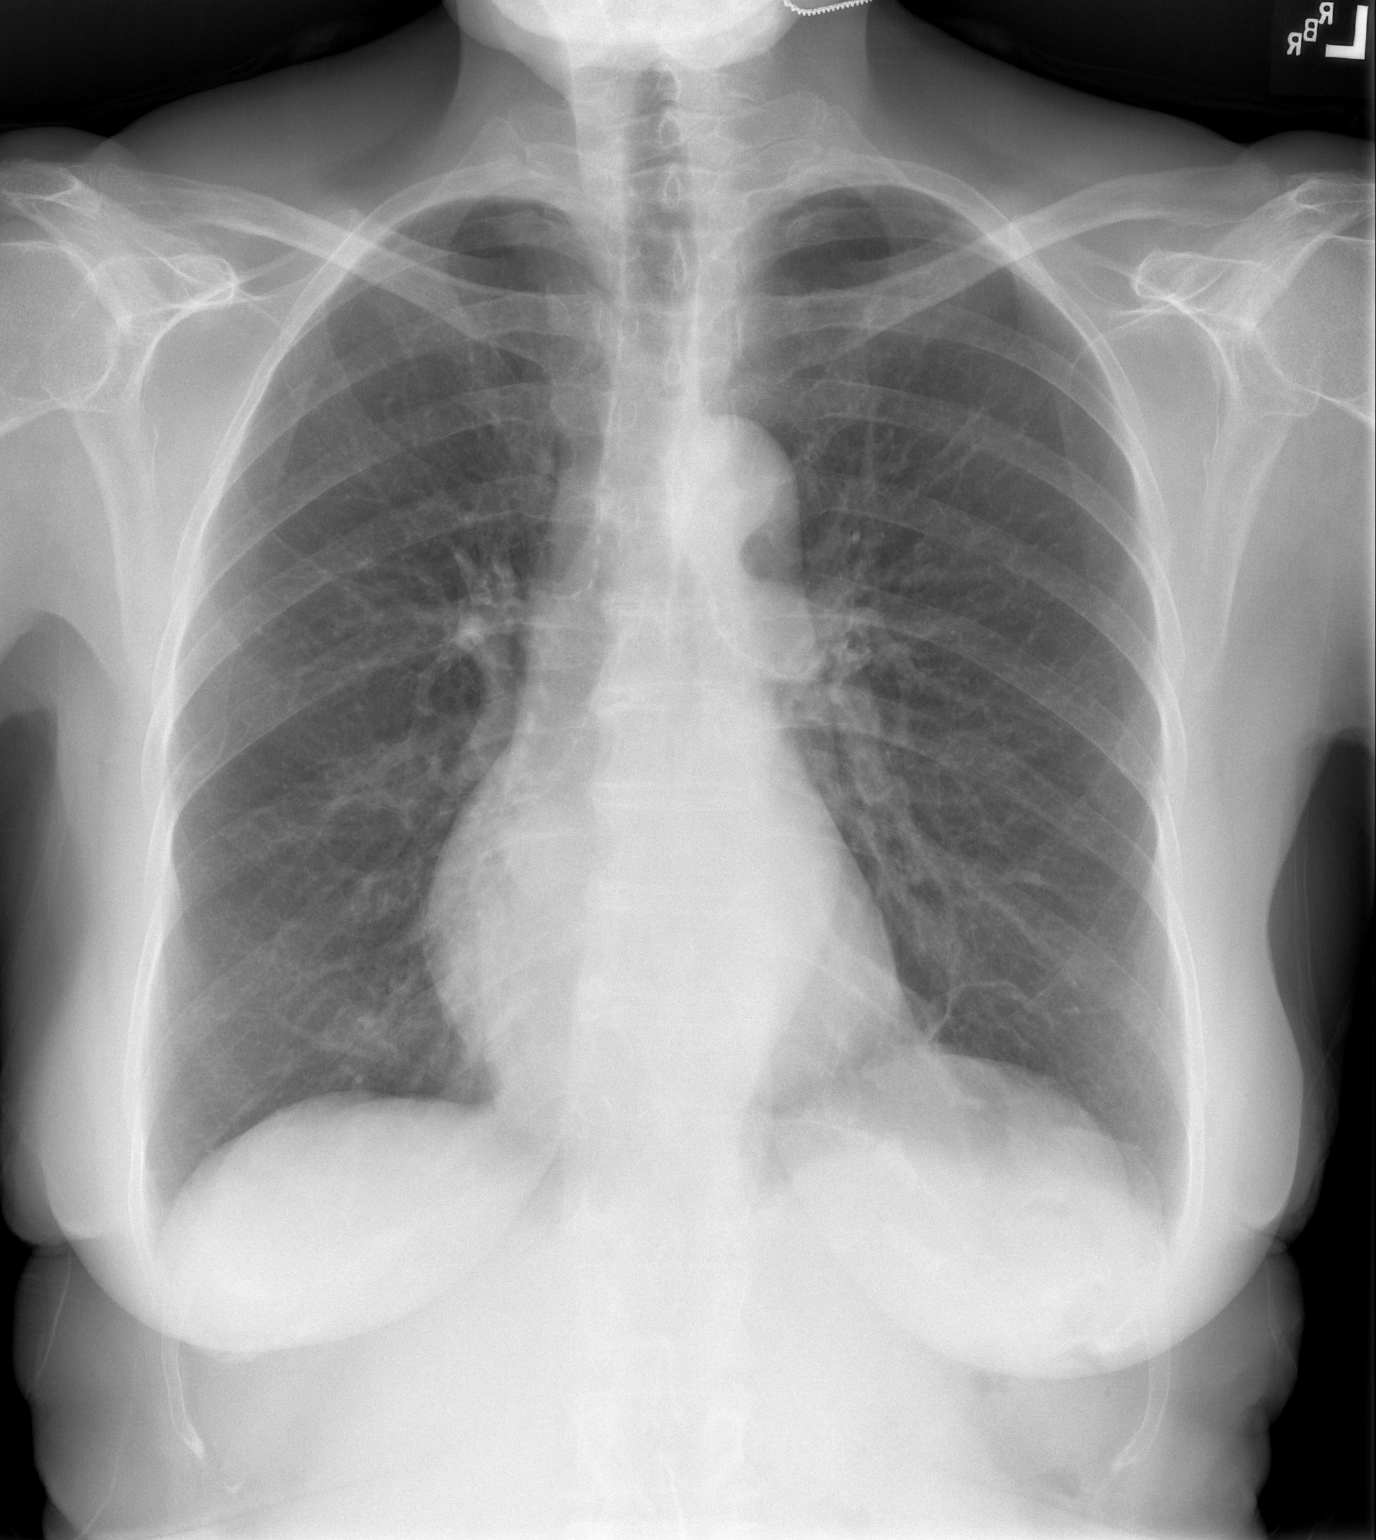

[w chest lat]
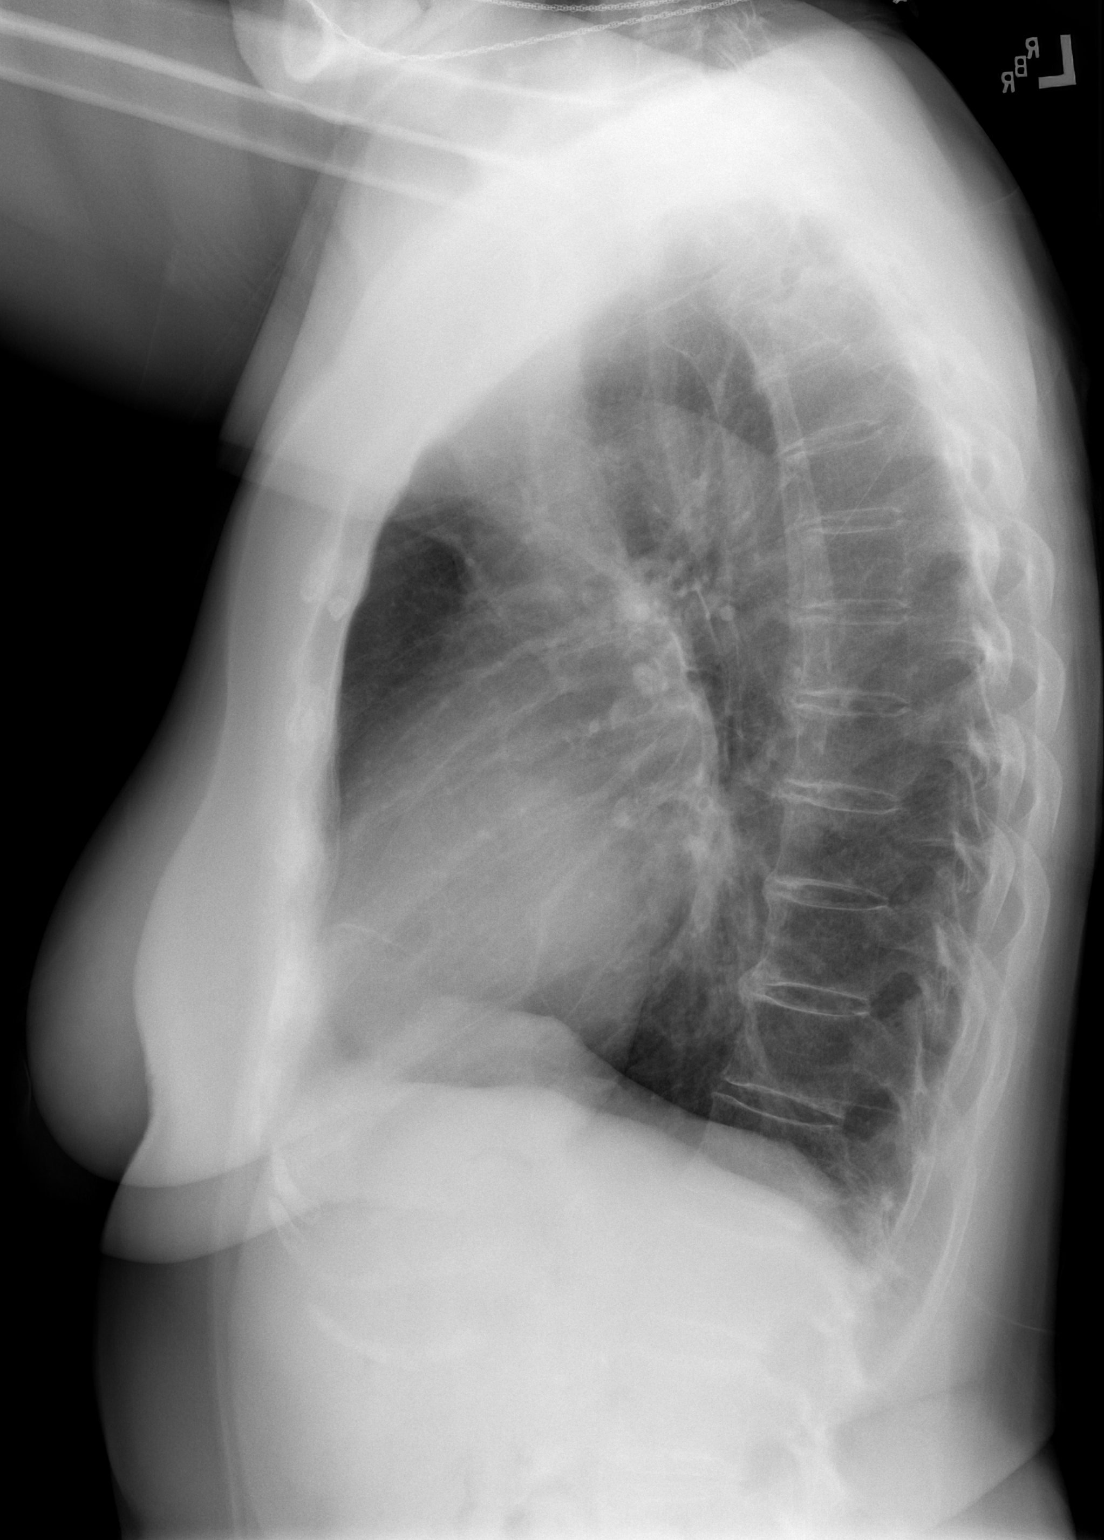

[2 of 2 positions shown; findings below may reference images not displayed]

FINDINGS: Cardiac shadow is stable. The lungs are hyperinflated consistent
with COPD. No focal infiltrate or sizable effusion is seen. Minimal
scarring in the left base is noted. No bony abnormality is seen.
IMPRESSION: COPD without acute abnormality.

## 2019-05-03 DIAGNOSIS — I480 Paroxysmal atrial fibrillation: Secondary | ICD-10-CM | POA: Diagnosis not present

## 2019-05-03 DIAGNOSIS — I10 Essential (primary) hypertension: Secondary | ICD-10-CM | POA: Diagnosis not present

## 2019-05-03 DIAGNOSIS — Z7901 Long term (current) use of anticoagulants: Secondary | ICD-10-CM | POA: Diagnosis not present

## 2019-05-03 DIAGNOSIS — D696 Thrombocytopenia, unspecified: Secondary | ICD-10-CM | POA: Diagnosis not present

## 2019-05-06 DIAGNOSIS — I480 Paroxysmal atrial fibrillation: Secondary | ICD-10-CM | POA: Diagnosis not present

## 2019-05-06 DIAGNOSIS — Z7901 Long term (current) use of anticoagulants: Secondary | ICD-10-CM | POA: Diagnosis not present

## 2019-06-02 ENCOUNTER — Other Ambulatory Visit: Payer: Self-pay | Admitting: Cardiology

## 2019-06-08 DIAGNOSIS — Z7901 Long term (current) use of anticoagulants: Secondary | ICD-10-CM | POA: Diagnosis not present

## 2019-06-23 ENCOUNTER — Encounter: Payer: Self-pay | Admitting: Cardiology

## 2019-06-23 ENCOUNTER — Other Ambulatory Visit: Payer: Self-pay

## 2019-06-23 ENCOUNTER — Ambulatory Visit (INDEPENDENT_AMBULATORY_CARE_PROVIDER_SITE_OTHER): Payer: PPO | Admitting: Cardiology

## 2019-06-23 VITALS — BP 148/64 | HR 63 | Ht 63.0 in | Wt 132.0 lb

## 2019-06-23 DIAGNOSIS — Z7901 Long term (current) use of anticoagulants: Secondary | ICD-10-CM | POA: Diagnosis not present

## 2019-06-23 DIAGNOSIS — E7800 Pure hypercholesterolemia, unspecified: Secondary | ICD-10-CM

## 2019-06-23 DIAGNOSIS — I480 Paroxysmal atrial fibrillation: Secondary | ICD-10-CM | POA: Diagnosis not present

## 2019-06-23 DIAGNOSIS — I10 Essential (primary) hypertension: Secondary | ICD-10-CM | POA: Diagnosis not present

## 2019-06-23 NOTE — Progress Notes (Signed)
Cardiology Office Note:    Date:  06/23/2019   ID:  Christine Baker, DOB 1935/06/28, MRN [id number]  PCP:  Raina Mina., MD  Cardiologist:  Jenean Lindau, MD   Referring MD: Raina Mina., MD    ASSESSMENT:    1. PAF (paroxysmal atrial fibrillation) ([place])   2. Essential (primary) hypertension   3. Encounter for current long-term use of anticoagulants   4. Pure hypercholesterolemia    PLAN:    In order of problems listed above:  1. Paroxysmal atrial fibrillation:I discussed with the patient atrial fibrillation, disease process. Management and therapy including rate and rhythm control, anticoagulation benefits and potential risks were discussed extensively with the patient. Patient had multiple questions which were answered to patient's satisfaction. 2. Essential hypertension: Blood pressure stable 3. Mixed dyslipidemia: I discussed lipids with her from the past and she plans to diet better and exercise better.  She does not want any statins or any such lipid-lowering therapy.  I respect her wishes.  She will have a Chem-7 and LFTs today as she is on amiodarone 4. Patient will be seen in follow-up appointment in 6 months or earlier if the patient has any concerns    Medication Adjustments/Labs and Tests Ordered: Current medicines are reviewed at length with the patient today.  Concerns regarding medicines are outlined above.  No orders of the defined types were placed in this encounter.  No orders of the defined types were placed in this encounter.    No chief complaint on file.    History of Present Illness:    Christine Baker is a 84 y.o. female.  Patient has past medical history approximately fibrillation, essential hypertension and mixed dyslipidemia.  She denies any problems at this time and takes care of activities of daily living.  No chest pain orthopnea or PND.  She is taking 50 mg of amiodarone on a daily basis.  She walks about 15 minutes a day on a regular  basis.  At the time of my evaluation, the patient is alert awake oriented and in no distress.  Past Medical History:  Diagnosis Date  . A-fib ([place])   . Arrhythmia     Past Surgical History:  Procedure Laterality Date  . ABDOMINAL HYSTERECTOMY    . VARICOSE VEIN SURGERY      Current Medications: Current Meds  Medication Sig  . Cholecalciferol (VITAMIN D3) 1000 units CAPS Take 3,000 Units by mouth daily.  . diphenhydrAMINE (BENADRYL) 25 mg capsule Take 50 mg by mouth as needed.  Marland Kitchen levothyroxine (SYNTHROID) 125 MCG tablet Take 75 mcg by mouth daily before breakfast.   . losartan (COZAAR) 50 MG tablet Take 1 tablet (50 mg total) by mouth daily.  . metoprolol tartrate (LOPRESSOR) 25 MG tablet Take 1/2 (one-half) tablet by mouth twice daily  . Multiple Vitamins-Minerals (PRESERVISION AREDS PO) Take 1 tablet by mouth 2 (two) times daily.  Marland Kitchen omeprazole (PRILOSEC) 20 MG capsule Take 20 mg by mouth daily.  Marland Kitchen PACERONE 100 MG tablet TAKE 1/2 (ONE-HALF) TABLET BY MOUTH ONCE DAILY  . warfarin (COUMADIN) 5 MG tablet Take one by mouth on Sunday. 1/2 tab on other days. Adjust per INR.     Allergies:   Bacitracin, Cefdinir, Tetracyclines & related, Erythromycin base, and Tetracycline   Social History   Socioeconomic History  . Marital status: Married    Spouse name: Not on file  . Number of children: Not on file  . Years of education: Not  on file  . Highest education level: Not on file  Occupational History  . Not on file  Social Needs  . Financial resource strain: Not on file  . Food insecurity    Worry: Not on file    Inability: Not on file  . Transportation needs    Medical: Not on file    Non-medical: Not on file  Tobacco Use  . Smoking status: Former Smoker    Packs/day: 0.50    Years: 15.00    Pack years: 7.50    Types: Cigarettes    Quit date: 1970    Years since quitting: 50.7  . Smokeless tobacco: Never Used  Substance and Sexual Activity  . Alcohol use: No  . Drug  use: No  . Sexual activity: Not on file  Lifestyle  . Physical activity    Days per week: Not on file    Minutes per session: Not on file  . Stress: Not on file  Relationships  . Social Herbalist on phone: Not on file    Gets together: Not on file    Attends religious service: Not on file    Active member of club or organization: Not on file    Attends meetings of clubs or organizations: Not on file    Relationship status: Not on file  Other Topics Concern  . Not on file  Social History Narrative  . Not on file     Family History: The patient's family history includes Diabetes in her mother, sister, and son; Heart attack in her father and mother; Stroke in her father and mother.  ROS:   Please see the history of present illness.    All other systems reviewed and are negative.  EKGs/Labs/Other Studies Reviewed:    The following studies were reviewed today: EKG reveals sinus rhythm and nonspecific ST-T changes.   Recent Labs: 12/21/2018: ALT 15; BUN 17; Creatinine, Ser 0.93; Hemoglobin 13.7; Platelets 154; Potassium 4.3; Sodium 140; TSH 1.460  Recent Lipid Panel    Component Value Date/Time   CHOL 220 (H) 12/21/2018 0946   TRIG 101 12/21/2018 0946   HDL 66 12/21/2018 0946   CHOLHDL 3.3 12/21/2018 0946   LDLCALC 134 (H) 12/21/2018 0946    Physical Exam:    VS:  BP (!) 148/64 (BP Location: Left Arm, Patient Position: Sitting, Cuff Size: Normal)   Pulse 63   Ht 5\' 3"  (1.6 m)   Wt 132 lb (59.9 kg)   SpO2 100%   BMI 23.38 kg/m     Wt Readings from Last 3 Encounters:  06/23/19 132 lb (59.9 kg)  12/21/18 134 lb (60.8 kg)  07/22/18 133 lb (60.3 kg)     GEN: Patient is in no acute distress HEENT: Normal NECK: No JVD; No carotid bruits LYMPHATICS: No lymphadenopathy CARDIAC: Hear sounds regular, 2/6 systolic murmur at the apex. RESPIRATORY:  Clear to auscultation without rales, wheezing or rhonchi  ABDOMEN: Soft, non-tender, non-distended  MUSCULOSKELETAL:  No edema; No deformity  SKIN: Warm and dry NEUROLOGIC:  Alert and oriented x 3 PSYCHIATRIC:  Normal affect   Signed, Jenean Lindau, MD  06/23/2019 11:39 AM    [place]

## 2019-06-23 NOTE — Patient Instructions (Signed)
Medication Instructions:  Your physician recommends that you continue on your current medications as directed. Please refer to the Current Medication list given to you today. If you need a refill on your cardiac medications before your next appointment, please call your pharmacy.   Lab work: Your physician recommends that you have a BMP and hepatic drawn today.  If you have labs (blood work) drawn today and your tests are completely normal, you will receive your results only by: Marland Kitchen MyChart Message (if you have MyChart) OR . A paper copy in the mail If you have any lab test that is abnormal or we need to change your treatment, we will call you to review the results.  Testing/Procedures: You had an EKG performed today.  Follow-Up: At [facility], you and your health needs are our priority.  As part of our continuing mission to provide you with exceptional heart care, we have created designated Provider Care Teams.  These Care Teams include your primary Cardiologist (physician) and Advanced Practice Providers (APPs -  Physician Assistants and Nurse Practitioners) who all work together to provide you with the care you need, when you need it. You will need a follow up appointment in 6 months.

## 2019-06-24 LAB — HEPATIC FUNCTION PANEL
ALT: 17 IU/L (ref 0–32)
AST: 23 IU/L (ref 0–40)
Albumin: 4.6 g/dL (ref 3.6–4.6)
Alkaline Phosphatase: 85 IU/L (ref 39–117)
Bilirubin Total: 0.8 mg/dL (ref 0.0–1.2)
Bilirubin, Direct: 0.17 mg/dL (ref 0.00–0.40)
Total Protein: 7 g/dL (ref 6.0–8.5)

## 2019-06-24 LAB — BASIC METABOLIC PANEL
BUN/Creatinine Ratio: 14 (ref 12–28)
BUN: 12 mg/dL (ref 8–27)
CO2: 26 mmol/L (ref 20–29)
Calcium: 9.9 mg/dL (ref 8.7–10.3)
Chloride: 102 mmol/L (ref 96–106)
Creatinine, Ser: 0.83 mg/dL (ref 0.57–1.00)
GFR calc Af Amer: 76 mL/min/{1.73_m2} (ref >59)
GFR calc non Af Amer: 66 mL/min/{1.73_m2} (ref >59)
Glucose: 78 mg/dL (ref 65–99)
Potassium: 4.7 mmol/L (ref 3.5–5.2)
Sodium: 141 mmol/L (ref 134–144)

## 2019-07-19 DIAGNOSIS — Z7901 Long term (current) use of anticoagulants: Secondary | ICD-10-CM | POA: Diagnosis not present

## 2019-08-09 DIAGNOSIS — Z961 Presence of intraocular lens: Secondary | ICD-10-CM | POA: Diagnosis not present

## 2019-08-09 DIAGNOSIS — H353132 Nonexudative age-related macular degeneration, bilateral, intermediate dry stage: Secondary | ICD-10-CM | POA: Diagnosis not present

## 2019-08-30 DIAGNOSIS — I480 Paroxysmal atrial fibrillation: Secondary | ICD-10-CM | POA: Diagnosis not present

## 2019-08-30 DIAGNOSIS — E039 Hypothyroidism, unspecified: Secondary | ICD-10-CM | POA: Diagnosis not present

## 2019-08-30 DIAGNOSIS — I10 Essential (primary) hypertension: Secondary | ICD-10-CM | POA: Diagnosis not present

## 2019-08-30 DIAGNOSIS — Z7901 Long term (current) use of anticoagulants: Secondary | ICD-10-CM | POA: Diagnosis not present

## 2019-08-30 DIAGNOSIS — Z23 Encounter for immunization: Secondary | ICD-10-CM | POA: Diagnosis not present

## 2019-08-30 DIAGNOSIS — D696 Thrombocytopenia, unspecified: Secondary | ICD-10-CM | POA: Diagnosis not present

## 2019-08-30 DIAGNOSIS — E7800 Pure hypercholesterolemia, unspecified: Secondary | ICD-10-CM | POA: Diagnosis not present

## 2019-08-30 DIAGNOSIS — Z79899 Other long term (current) drug therapy: Secondary | ICD-10-CM | POA: Diagnosis not present

## 2019-09-22 ENCOUNTER — Other Ambulatory Visit: Payer: Self-pay | Admitting: Cardiology

## 2019-09-24 DIAGNOSIS — Z20828 Contact with and (suspected) exposure to other viral communicable diseases: Secondary | ICD-10-CM | POA: Diagnosis not present

## 2019-09-24 DIAGNOSIS — R519 Headache, unspecified: Secondary | ICD-10-CM | POA: Diagnosis not present

## 2019-10-06 DIAGNOSIS — Z7901 Long term (current) use of anticoagulants: Secondary | ICD-10-CM | POA: Diagnosis not present

## 2019-11-14 DIAGNOSIS — Z7901 Long term (current) use of anticoagulants: Secondary | ICD-10-CM | POA: Diagnosis not present

## 2019-12-22 DIAGNOSIS — Z7901 Long term (current) use of anticoagulants: Secondary | ICD-10-CM | POA: Diagnosis not present

## 2019-12-23 DIAGNOSIS — S8012XA Contusion of left lower leg, initial encounter: Secondary | ICD-10-CM | POA: Diagnosis not present

## 2019-12-23 DIAGNOSIS — Z7901 Long term (current) use of anticoagulants: Secondary | ICD-10-CM | POA: Diagnosis not present

## 2019-12-23 DIAGNOSIS — I8393 Asymptomatic varicose veins of bilateral lower extremities: Secondary | ICD-10-CM | POA: Diagnosis not present

## 2019-12-23 DIAGNOSIS — I480 Paroxysmal atrial fibrillation: Secondary | ICD-10-CM | POA: Diagnosis not present

## 2019-12-27 ENCOUNTER — Other Ambulatory Visit: Payer: Self-pay | Admitting: Cardiology

## 2020-01-03 DIAGNOSIS — L570 Actinic keratosis: Secondary | ICD-10-CM | POA: Diagnosis not present

## 2020-01-03 DIAGNOSIS — L719 Rosacea, unspecified: Secondary | ICD-10-CM | POA: Diagnosis not present

## 2020-01-03 DIAGNOSIS — L300 Nummular dermatitis: Secondary | ICD-10-CM | POA: Diagnosis not present

## 2020-01-03 DIAGNOSIS — L299 Pruritus, unspecified: Secondary | ICD-10-CM | POA: Diagnosis not present

## 2020-01-23 ENCOUNTER — Other Ambulatory Visit: Payer: Self-pay

## 2020-01-24 ENCOUNTER — Encounter: Payer: Self-pay | Admitting: Cardiology

## 2020-01-24 ENCOUNTER — Other Ambulatory Visit: Payer: Self-pay

## 2020-01-24 ENCOUNTER — Ambulatory Visit: Payer: PPO | Admitting: Cardiology

## 2020-01-24 VITALS — BP 154/72 | HR 69 | Temp 97.4°F | Ht 63.0 in | Wt 133.6 lb

## 2020-01-24 DIAGNOSIS — I480 Paroxysmal atrial fibrillation: Secondary | ICD-10-CM | POA: Diagnosis not present

## 2020-01-24 DIAGNOSIS — Z79899 Other long term (current) drug therapy: Secondary | ICD-10-CM

## 2020-01-24 DIAGNOSIS — I10 Essential (primary) hypertension: Secondary | ICD-10-CM | POA: Diagnosis not present

## 2020-01-24 DIAGNOSIS — Z7901 Long term (current) use of anticoagulants: Secondary | ICD-10-CM | POA: Diagnosis not present

## 2020-01-24 HISTORY — DX: Other long term (current) drug therapy: Z79.899

## 2020-01-24 MED ORDER — METOPROLOL TARTRATE 25 MG PO TABS: ORAL_TABLET | ORAL | 3 refills | Status: DC

## 2020-01-24 NOTE — Progress Notes (Signed)
Cardiology Office Note:    Date:  01/24/2020   ID:  Christine Baker, DOB January 11, 1935, MRN [id number]  PCP:  Raina Mina., MD  Cardiologist:  Jenean Lindau, MD   Referring MD: Raina Mina., MD    ASSESSMENT:    1. Paroxysmal atrial fibrillation (HCC)   2. High risk medication use   3. Encounter for current long-term use of anticoagulants   4. Essential (primary) hypertension   5. On amiodarone therapy    PLAN:    In order of problems listed above:  Paroxysmal atrial fibrillation:I discussed with the patient atrial fibrillation, disease process. Management and therapy including rate and rhythm control, anticoagulation benefits and potential risks were discussed extensively with the patient. Patient had multiple questions which were answered to patient's satisfaction.  Patient is on amiodarone therapy and therefore we will do LFTs today.  We will also do all other blood work including fasting lipids.  She will also have a chest x-ray. Essential hypertension: Blood pressure is stable She requests refill her medications and we will do that for her. Patient will be seen in follow-up appointment in 6 months or earlier if the patient has any concerns  Medication Adjustments/Labs and Tests Ordered: Current medicines are reviewed at length with the patient today.  Concerns regarding medicines are outlined above.  No orders of the defined types were placed in this encounter.  No orders of the defined types were placed in this encounter.    No chief complaint on file.    History of Present Illness:    Christine Baker is a 85 y.o. female.  Patient has past medical history of paroxysmal atrial fibrillation.  She denies any problems at this time and takes care of activities of daily living.  No chest pain orthopnea or PND.  She is leading an active lifestyle.  She takes 50 mg of amiodarone on a daily basis.  She denies any palpitations dizziness or any such symptoms.  Past Medical  History:  Diagnosis Date  . A-fib ([place])   . Acquired hypothyroidism 12/03/2015   Last Assessment & Plan:  Formatting of this note might be different from the original. Relevant Hx: Course: Daily Update: Today's Plan:this needs to be checked  Electronically signed by: Mayer Camel, NP 03/19/16 [phone]  . Arrhythmia   . Dysuria 09/09/2018  . Encounter for current long-term use of anticoagulants 12/03/2015   Last Assessment & Plan:  Relevant Hx: Course: Daily Update: Today's Plan:return for her protime  Electronically signed by: Mayer Camel, NP 12/03/15 [phone]  Last Assessment & Plan:  Formatting of this note might be different from the original. Relevant Hx: Course: Daily Update: Today's Plan:return for her protime  Electronically signed by: Mayer Camel, NP 12/03/15 [phone]  . Essential (primary) hypertension 12/03/2015   Last Assessment & Plan:  Relevant Hx: Course: Daily Update: Today's Plan:this is stable for her overall  Electronically signed by: Mayer Camel, NP 03/19/16 [phone]  Last Assessment & Plan:  Formatting of this note might be different from the original. Relevant Hx: Course: Daily Update: Today's Plan:this is stable for her overall  Electronically signed by: Mayer Camel, NP  . Gastro-esophageal reflux disease with esophagitis 12/03/2015   Last Assessment & Plan:  Relevant Hx: Course: Daily Update: Today's Plan:this is stable for her  Electronically signed by: Mayer Camel, NP 03/19/16 [phone]  Last Assessment & Plan:  Formatting of this note might be different from the original.  Relevant Hx: Course: Daily Update: Today's Plan:this is stable for her  Electronically signed by: Mayer Camel, NP 03/19/16 [phone]  . Hair loss 10/02/2016  . High risk medication use 10/21/2017  . Local edema 10/02/2016  . Osteopenia of multiple sites 12/03/2015   Last Assessment & Plan:  Relevant Hx: Course: Daily Update: Today's  Plan:increase her vitamin d  Electronically signed by: Mayer Camel, NP 03/19/16 [phone]  Last Assessment & Plan:  Formatting of this note might be different from the original. Relevant Hx: Course: Daily Update: Today's Plan:increase her vitamin d  Electronically signed by: Mayer Camel, NP 03/19/16 [phone]  . Paroxysmal atrial fibrillation ([place]) 12/03/2015   Last Assessment & Plan:  Formatting of this note might be different from the original. Relevant Hx: Course: Daily Update: Today's Plan:slight increase in SOB and concerning for her and she is going to be referred back [facility]  Electronically signed by: Mayer Camel, NP 03/19/16 0930  . Perennial allergic rhinitis 12/03/2015   Last Assessment & Plan:  Relevant Hx: Course: Daily Update: Today's Plan:discussed this in depth and can randomly take this   Electronically signed by: Mayer Camel, NP 03/19/16 [phone]  Last Assessment & Plan:  Formatting of this note might be different from the original. Relevant Hx: Course: Daily Update: Today's Plan:discussed this in depth and can randomly take this   Electronically  . Primary insomnia 10/21/2017  . Primary osteoarthritis involving multiple joints 12/03/2015   Last Assessment & Plan:  Relevant Hx: Course: Daily Update: Today's Plan:she feels that this is stable for her overall  Electronically signed by: Mayer Camel, NP 03/19/16 [phone]  Last Assessment & Plan:  Formatting of this note might be different from the original. Relevant Hx: Course: Daily Update: Today's Plan:she feels that this is stable for her overall  Electronically signed by:   Marland Kitchen Pure hypercholesterolemia 12/03/2015   Last Assessment & Plan:  Relevant Hx: Course: Daily Update: Today's Plan:this is to be checked fasting  Electronically signed by: Mayer Camel, NP 03/19/16 [phone]  Last Assessment & Plan:  Formatting of this note might be different from the original. Relevant Hx:  Course: Daily Update: Today's Plan:this is to be checked fasting  Electronically signed by: Mayer Camel, NP 0  . Rash of face 12/15/2017  . Swelling of face 12/15/2017  . Thrombocytopenia ([place]) 10/22/2018  . Urge incontinence 03/19/2016   Last Assessment & Plan:  Relevant Hx: Course: Daily Update: Today's Plan:detrol will be tried for this  Electronically signed by: Mayer Camel, NP 03/19/16 0931  Last Assessment & Plan:  Formatting of this note might be different from the original. Relevant Hx: Course: Daily Update: Today's Plan:detrol will be tried for this  Electronically signed by: Mayer Camel, NP 0  . UTI symptoms 09/09/2018    Past Surgical History:  Procedure Laterality Date  . ABDOMINAL HYSTERECTOMY    . VARICOSE VEIN SURGERY      Current Medications: Current Meds  Medication Sig  . Cholecalciferol (VITAMIN D3) 1000 units CAPS Take 3,000 Units by mouth daily.  . diphenhydrAMINE (BENADRYL) 25 mg capsule Take 50 mg by mouth as needed.  Marland Kitchen levothyroxine (SYNTHROID) 125 MCG tablet TAKE 1/2 (ONE-HALF) TABLET BY MOUTH IN THE MORNING AT 6 AM  . losartan (COZAAR) 50 MG tablet Take 1 tablet (50 mg total) by mouth daily.  . metoprolol tartrate (LOPRESSOR) 25 MG tablet Take 1/2 (one-half) tablet by mouth twice daily  .  Multiple Vitamins-Minerals (ICAPS AREDS 2 PO) Take 2 capsules by mouth daily.  Marland Kitchen omeprazole (PRILOSEC) 20 MG capsule Take 20 mg by mouth daily.  Marland Kitchen PACERONE 100 MG tablet Take 1/2 (one-half) tablet by mouth once daily  . triamcinolone cream (KENALOG) 0.1 % as needed.  . warfarin (COUMADIN) 5 MG tablet Take one by mouth on Sunday. 1/2 tab on other days. Adjust per INR.     Allergies:   Bacitracin, Cefdinir, Tetracyclines & related, Erythromycin base, and Tetracycline   Social History   Socioeconomic History  . Marital status: Married    Spouse name: Not on file  . Number of children: Not on file  . Years of education: Not on file    . Highest education level: Not on file  Occupational History  . Not on file  Tobacco Use  . Smoking status: Former Smoker    Packs/day: 0.50    Years: 15.00    Pack years: 7.50    Types: Cigarettes    Quit date: 1970    Years since quitting: 51.3  . Smokeless tobacco: Never Used  Substance and Sexual Activity  . Alcohol use: No  . Drug use: No  . Sexual activity: Not on file  Other Topics Concern  . Not on file  Social History Narrative  . Not on file   Social Determinants of Health   Financial Resource Strain:   . Difficulty of Paying Living Expenses:   Food Insecurity:   . Worried About Charity fundraiser in the Last Year:   . Arboriculturist in the Last Year:   Transportation Needs:   . Film/video editor (Medical):   Marland Kitchen Lack of Transportation (Non-Medical):   Physical Activity:   . Days of Exercise per Week:   . Minutes of Exercise per Session:   Stress:   . Feeling of Stress :   Social Connections:   . Frequency of Communication with Friends and Family:   . Frequency of Social Gatherings with Friends and Family:   . Attends Religious Services:   . Active Member of Clubs or Organizations:   . Attends Archivist Meetings:   Marland Kitchen Marital Status:      Family History: The patient's family history includes Diabetes in her mother, sister, and son; Heart attack in her father and mother; Stroke in her father and mother.  ROS:   Please see the history of present illness.    All other systems reviewed and are negative.  EKGs/Labs/Other Studies Reviewed:    The following studies were reviewed today: I reviewed lab work from the [employer] she did lipids are elevated.  I discussed this with her at length.   Recent Labs: 06/23/2019: ALT 17; BUN 12; Creatinine, Ser 0.83; Potassium 4.7; Sodium 141  Recent Lipid Panel    Component Value Date/Time   CHOL 220 (H) 12/21/2018 0946   TRIG 101 12/21/2018 0946   HDL 66 12/21/2018 0946   CHOLHDL 3.3 12/21/2018 0946    LDLCALC 134 (H) 12/21/2018 0946    Physical Exam:    VS:  BP (!) 154/72   Pulse 69   Temp (!) 97.4 F (36.3 C)   Ht 5\' 3"  (1.6 m)   Wt 133 lb 9.6 oz (60.6 kg)   SpO2 98%   BMI 23.67 kg/m     Wt Readings from Last 3 Encounters:  01/24/20 133 lb 9.6 oz (60.6 kg)  06/23/19 132 lb (59.9 kg)  12/21/18 134 lb (  60.8 kg)     GEN: Patient is in no acute distress HEENT: Normal NECK: No JVD; No carotid bruits LYMPHATICS: No lymphadenopathy CARDIAC: Hear sounds regular, 2/6 systolic murmur at the apex. RESPIRATORY:  Clear to auscultation without rales, wheezing or rhonchi  ABDOMEN: Soft, non-tender, non-distended MUSCULOSKELETAL:  No edema; No deformity  SKIN: Warm and dry NEUROLOGIC:  Alert and oriented x 3 PSYCHIATRIC:  Normal affect   Signed, Jenean Lindau, MD  01/24/2020 9:43 AM    [place]

## 2020-01-24 NOTE — Patient Instructions (Signed)
Medication Instructions:  No medication changes *If you need a refill on your cardiac medications before your next appointment, please call your pharmacy*   Lab Work: Your physician recommends that you have a BMET, CBC, TSH, LFT and Lipids today.  If you have labs (blood work) drawn today and your tests are completely normal, you will receive your results only by: Marland Kitchen MyChart Message (if you have MyChart) OR . A paper copy in the mail If you have any lab test that is abnormal or we need to change your treatment, we will call you to review the results.   Testing/Procedures: A chest x-ray takes a picture of the organs and structures inside the chest, including the heart, lungs, and blood vessels. This test can show several things, including, whether the heart is enlarges; whether fluid is building up in the lungs; and whether pacemaker / defibrillator leads are still in place.   Follow-Up: At [facility], you and your health needs are our priority.  As part of our continuing mission to provide you with exceptional heart care, we have created designated Provider Care Teams.  These Care Teams include your primary Cardiologist (physician) and Advanced Practice Providers (APPs -  Physician Assistants and Nurse Practitioners) who all work together to provide you with the care you need, when you need it.  We recommend signing up for the patient portal called "MyChart".  Sign up information is provided on this After Visit Summary.  MyChart is used to connect with patients for Virtual Visits (Telemedicine).  Patients are able to view lab/test results, encounter notes, upcoming appointments, etc.  Non-urgent messages can be sent to your provider as well.   To learn more about what you can do with MyChart, go to [email or web address].    Your next appointment:   6 month(s)  The format for your next appointment:   In Person  Provider:   Jyl Heinz, MD   Other Instructions NA

## 2020-01-25 LAB — BASIC METABOLIC PANEL
BUN/Creatinine Ratio: 18 (ref 12–28)
BUN: 14 mg/dL (ref 8–27)
CO2: 25 mmol/L (ref 20–29)
Calcium: 10 mg/dL (ref 8.7–10.3)
Chloride: 101 mmol/L (ref 96–106)
Creatinine, Ser: 0.77 mg/dL (ref 0.57–1.00)
GFR calc Af Amer: 83 mL/min/{1.73_m2} (ref >59)
GFR calc non Af Amer: 72 mL/min/{1.73_m2} (ref >59)
Glucose: 93 mg/dL (ref 65–99)
Potassium: 4.4 mmol/L (ref 3.5–5.2)
Sodium: 140 mmol/L (ref 134–144)

## 2020-01-25 LAB — CBC WITH DIFFERENTIAL/PLATELET
Basophils Absolute: 0 10*3/uL (ref 0.0–0.2)
Basos: 1 %
EOS (ABSOLUTE): 0.1 10*3/uL (ref 0.0–0.4)
Eos: 2 %
Hematocrit: 42.3 % (ref 34.0–46.6)
Hemoglobin: 14.2 g/dL (ref 11.1–15.9)
Immature Grans (Abs): 0 10*3/uL (ref 0.0–0.1)
Immature Granulocytes: 1 %
Lymphocytes Absolute: 1.3 10*3/uL (ref 0.7–3.1)
Lymphs: 29 %
MCH: 31 pg (ref 26.6–33.0)
MCHC: 33.6 g/dL (ref 31.5–35.7)
MCV: 92 fL (ref 79–97)
Monocytes Absolute: 0.5 10*3/uL (ref 0.1–0.9)
Monocytes: 11 %
Neutrophils Absolute: 2.6 10*3/uL (ref 1.4–7.0)
Neutrophils: 56 %
Platelets: 149 10*3/uL — ABNORMAL LOW (ref 150–450)
RBC: 4.58 x10E6/uL (ref 3.77–5.28)
RDW: 13 % (ref 11.7–15.4)
WBC: 4.6 10*3/uL (ref 3.4–10.8)

## 2020-01-25 LAB — HEPATIC FUNCTION PANEL
ALT: 13 IU/L (ref 0–32)
AST: 20 IU/L (ref 0–40)
Albumin: 4.6 g/dL (ref 3.6–4.6)
Alkaline Phosphatase: 89 IU/L (ref 39–117)
Bilirubin Total: 0.6 mg/dL (ref 0.0–1.2)
Bilirubin, Direct: 0.16 mg/dL (ref 0.00–0.40)
Total Protein: 7.2 g/dL (ref 6.0–8.5)

## 2020-01-25 LAB — LIPID PANEL
Chol/HDL Ratio: 3.4 ratio (ref 0.0–4.4)
Cholesterol, Total: 224 mg/dL — ABNORMAL HIGH (ref 100–199)
HDL: 66 mg/dL (ref >39)
LDL Chol Calc (NIH): 137 mg/dL — ABNORMAL HIGH (ref 0–99)
Triglycerides: 119 mg/dL (ref 0–149)
VLDL Cholesterol Cal: 21 mg/dL (ref 5–40)

## 2020-01-25 LAB — PROTIME-INR
INR: 2.7 — ABNORMAL HIGH (ref 0.9–1.2)
Prothrombin Time: 28 s — ABNORMAL HIGH (ref 9.1–12.0)

## 2020-01-25 LAB — TSH: TSH: 2.47 u[IU]/mL (ref 0.450–4.500)

## 2020-01-25 LAB — APTT: aPTT: 40 s — ABNORMAL HIGH (ref 24–33)

## 2020-02-23 DIAGNOSIS — Z7901 Long term (current) use of anticoagulants: Secondary | ICD-10-CM | POA: Diagnosis not present

## 2020-02-23 DIAGNOSIS — K2100 Gastro-esophageal reflux disease with esophagitis, without bleeding: Secondary | ICD-10-CM | POA: Diagnosis not present

## 2020-02-23 DIAGNOSIS — N3941 Urge incontinence: Secondary | ICD-10-CM | POA: Diagnosis not present

## 2020-02-23 DIAGNOSIS — I10 Essential (primary) hypertension: Secondary | ICD-10-CM | POA: Diagnosis not present

## 2020-02-23 DIAGNOSIS — M8949 Other hypertrophic osteoarthropathy, multiple sites: Secondary | ICD-10-CM | POA: Diagnosis not present

## 2020-02-23 DIAGNOSIS — E7800 Pure hypercholesterolemia, unspecified: Secondary | ICD-10-CM | POA: Diagnosis not present

## 2020-02-23 DIAGNOSIS — M8589 Other specified disorders of bone density and structure, multiple sites: Secondary | ICD-10-CM | POA: Diagnosis not present

## 2020-02-23 DIAGNOSIS — Z0000 Encounter for general adult medical examination without abnormal findings: Secondary | ICD-10-CM | POA: Diagnosis not present

## 2020-02-23 DIAGNOSIS — I480 Paroxysmal atrial fibrillation: Secondary | ICD-10-CM | POA: Diagnosis not present

## 2020-02-23 DIAGNOSIS — D696 Thrombocytopenia, unspecified: Secondary | ICD-10-CM | POA: Diagnosis not present

## 2020-02-23 DIAGNOSIS — F419 Anxiety disorder, unspecified: Secondary | ICD-10-CM | POA: Diagnosis not present

## 2020-02-23 DIAGNOSIS — Z780 Asymptomatic menopausal state: Secondary | ICD-10-CM | POA: Diagnosis not present

## 2020-02-23 DIAGNOSIS — E039 Hypothyroidism, unspecified: Secondary | ICD-10-CM | POA: Diagnosis not present

## 2020-03-26 ENCOUNTER — Encounter: Payer: Self-pay | Admitting: Cardiology

## 2020-03-26 DIAGNOSIS — Z79899 Other long term (current) drug therapy: Secondary | ICD-10-CM | POA: Diagnosis not present

## 2020-03-26 DIAGNOSIS — N958 Other specified menopausal and perimenopausal disorders: Secondary | ICD-10-CM | POA: Diagnosis not present

## 2020-03-26 DIAGNOSIS — M810 Age-related osteoporosis without current pathological fracture: Secondary | ICD-10-CM | POA: Diagnosis not present

## 2020-03-26 DIAGNOSIS — J449 Chronic obstructive pulmonary disease, unspecified: Secondary | ICD-10-CM | POA: Diagnosis not present

## 2020-03-28 DIAGNOSIS — Z7901 Long term (current) use of anticoagulants: Secondary | ICD-10-CM | POA: Diagnosis not present

## 2020-04-12 DIAGNOSIS — Z961 Presence of intraocular lens: Secondary | ICD-10-CM | POA: Diagnosis not present

## 2020-04-12 DIAGNOSIS — H353132 Nonexudative age-related macular degeneration, bilateral, intermediate dry stage: Secondary | ICD-10-CM | POA: Diagnosis not present

## 2020-04-12 DIAGNOSIS — H04123 Dry eye syndrome of bilateral lacrimal glands: Secondary | ICD-10-CM | POA: Diagnosis not present

## 2020-04-12 DIAGNOSIS — H5203 Hypermetropia, bilateral: Secondary | ICD-10-CM | POA: Diagnosis not present

## 2020-05-01 DIAGNOSIS — Z7901 Long term (current) use of anticoagulants: Secondary | ICD-10-CM | POA: Diagnosis not present

## 2020-05-02 DIAGNOSIS — I480 Paroxysmal atrial fibrillation: Secondary | ICD-10-CM | POA: Diagnosis not present

## 2020-05-02 DIAGNOSIS — R42 Dizziness and giddiness: Secondary | ICD-10-CM | POA: Diagnosis not present

## 2020-05-02 DIAGNOSIS — F430 Acute stress reaction: Secondary | ICD-10-CM

## 2020-05-02 DIAGNOSIS — M810 Age-related osteoporosis without current pathological fracture: Secondary | ICD-10-CM | POA: Diagnosis not present

## 2020-05-02 DIAGNOSIS — I10 Essential (primary) hypertension: Secondary | ICD-10-CM | POA: Diagnosis not present

## 2020-05-02 HISTORY — DX: Acute stress reaction: F43.0

## 2020-05-23 DIAGNOSIS — R5383 Other fatigue: Secondary | ICD-10-CM | POA: Diagnosis not present

## 2020-05-23 DIAGNOSIS — R079 Chest pain, unspecified: Secondary | ICD-10-CM | POA: Diagnosis not present

## 2020-05-23 DIAGNOSIS — I10 Essential (primary) hypertension: Secondary | ICD-10-CM | POA: Diagnosis not present

## 2020-05-23 DIAGNOSIS — E039 Hypothyroidism, unspecified: Secondary | ICD-10-CM | POA: Diagnosis not present

## 2020-05-23 DIAGNOSIS — K2100 Gastro-esophageal reflux disease with esophagitis, without bleeding: Secondary | ICD-10-CM | POA: Diagnosis not present

## 2020-05-23 DIAGNOSIS — R5381 Other malaise: Secondary | ICD-10-CM

## 2020-05-23 HISTORY — DX: Other malaise: R53.81

## 2020-06-04 DIAGNOSIS — Z7901 Long term (current) use of anticoagulants: Secondary | ICD-10-CM | POA: Diagnosis not present

## 2020-06-05 DIAGNOSIS — E538 Deficiency of other specified B group vitamins: Secondary | ICD-10-CM | POA: Diagnosis not present

## 2020-06-21 ENCOUNTER — Other Ambulatory Visit: Payer: Self-pay | Admitting: Cardiology

## 2020-06-25 DIAGNOSIS — E039 Hypothyroidism, unspecified: Secondary | ICD-10-CM | POA: Diagnosis not present

## 2020-07-05 DIAGNOSIS — J029 Acute pharyngitis, unspecified: Secondary | ICD-10-CM | POA: Diagnosis not present

## 2020-07-05 DIAGNOSIS — R070 Pain in throat: Secondary | ICD-10-CM | POA: Diagnosis not present

## 2020-07-05 DIAGNOSIS — R519 Headache, unspecified: Secondary | ICD-10-CM | POA: Diagnosis not present

## 2020-07-05 DIAGNOSIS — R5382 Chronic fatigue, unspecified: Secondary | ICD-10-CM | POA: Diagnosis not present

## 2020-07-05 DIAGNOSIS — Z20828 Contact with and (suspected) exposure to other viral communicable diseases: Secondary | ICD-10-CM | POA: Diagnosis not present

## 2020-07-26 DIAGNOSIS — E039 Hypothyroidism, unspecified: Secondary | ICD-10-CM | POA: Diagnosis not present

## 2020-07-26 DIAGNOSIS — E538 Deficiency of other specified B group vitamins: Secondary | ICD-10-CM | POA: Diagnosis not present

## 2020-07-26 DIAGNOSIS — Z7901 Long term (current) use of anticoagulants: Secondary | ICD-10-CM | POA: Diagnosis not present

## 2020-07-26 DIAGNOSIS — I480 Paroxysmal atrial fibrillation: Secondary | ICD-10-CM | POA: Diagnosis not present

## 2020-07-26 DIAGNOSIS — D696 Thrombocytopenia, unspecified: Secondary | ICD-10-CM | POA: Diagnosis not present

## 2020-07-26 DIAGNOSIS — Z23 Encounter for immunization: Secondary | ICD-10-CM | POA: Diagnosis not present

## 2020-07-26 DIAGNOSIS — F5101 Primary insomnia: Secondary | ICD-10-CM | POA: Diagnosis not present

## 2020-07-26 DIAGNOSIS — K2100 Gastro-esophageal reflux disease with esophagitis, without bleeding: Secondary | ICD-10-CM | POA: Diagnosis not present

## 2020-07-26 DIAGNOSIS — F419 Anxiety disorder, unspecified: Secondary | ICD-10-CM | POA: Diagnosis not present

## 2020-07-26 DIAGNOSIS — I10 Essential (primary) hypertension: Secondary | ICD-10-CM | POA: Diagnosis not present

## 2020-07-26 DIAGNOSIS — M8949 Other hypertrophic osteoarthropathy, multiple sites: Secondary | ICD-10-CM | POA: Diagnosis not present

## 2020-07-31 ENCOUNTER — Other Ambulatory Visit: Payer: Self-pay

## 2020-07-31 DIAGNOSIS — I4891 Unspecified atrial fibrillation: Secondary | ICD-10-CM | POA: Insufficient documentation

## 2020-07-31 DIAGNOSIS — I499 Cardiac arrhythmia, unspecified: Secondary | ICD-10-CM | POA: Insufficient documentation

## 2020-08-02 ENCOUNTER — Other Ambulatory Visit: Payer: Self-pay

## 2020-08-02 ENCOUNTER — Encounter: Payer: Self-pay | Admitting: Cardiology

## 2020-08-02 ENCOUNTER — Ambulatory Visit: Payer: PPO | Admitting: Cardiology

## 2020-08-02 ENCOUNTER — Telehealth: Payer: Self-pay

## 2020-08-02 VITALS — BP 154/74 | HR 61 | Ht 62.0 in | Wt 132.6 lb

## 2020-08-02 DIAGNOSIS — Z7901 Long term (current) use of anticoagulants: Secondary | ICD-10-CM

## 2020-08-02 DIAGNOSIS — Z79899 Other long term (current) drug therapy: Secondary | ICD-10-CM

## 2020-08-02 DIAGNOSIS — I10 Essential (primary) hypertension: Secondary | ICD-10-CM | POA: Diagnosis not present

## 2020-08-02 DIAGNOSIS — Z5181 Encounter for therapeutic drug level monitoring: Secondary | ICD-10-CM | POA: Diagnosis not present

## 2020-08-02 DIAGNOSIS — I480 Paroxysmal atrial fibrillation: Secondary | ICD-10-CM | POA: Diagnosis not present

## 2020-08-02 NOTE — Progress Notes (Signed)
Cardiology Office Note:    Date:  08/02/2020   ID:  BECCA BAYNE, DOB [birth year], MRN [id number]  PCP:  Raina Mina., MD  Cardiologist:  Jenean Lindau, MD   Referring MD: Raina Mina., MD    ASSESSMENT:    1. Paroxysmal atrial fibrillation (HCC)   2. On amiodarone therapy   3. Essential (primary) hypertension    PLAN:    In order of problems listed above:  1. Primary prevention stressed with the patient. Importance of compliance with diet medication stressed and she vocalized understanding. 2. Essential hypertension: Blood pressure stable and diet was emphasized. Weight reduction was stressed. She was advised to walk on a regular basis. At least half an hour a day 5 days a week and she promises to do so. 3. Paroxysmal atrial fibrillation: I discussed with the patient atrial fibrillation, disease process. Management and therapy including rate and rhythm control, anticoagulation benefits and potential risks were discussed extensively with the patient. Patient had multiple questions which were answered to patient's satisfaction. Patient is on amiodarone therapy and will have blood work today. We will lipids also. I also mentioned the patient about newer anticoagulants and she is agreeable. We will do a pro time checked today and we will initiate her on Xarelto give her samples for this. She has used Xarelto in the past no significant issues with the medicine. 4. Patient will be seen in follow-up appointment in 6 months or earlier if the patient has any concerns    Medication Adjustments/Labs and Tests Ordered: Current medicines are reviewed at length with the patient today.  Concerns regarding medicines are outlined above.  No orders of the defined types were placed in this encounter.  No orders of the defined types were placed in this encounter.    No chief complaint on file.    History of Present Illness:    CEAIRA ERNSTER is a 85 y.o. female. Patient has past medical  history of paroxysmal fibrillation and essential hypertension. She is on amiodarone therapy and warfarin. She denies any problems at this time and takes care of activities of daily living. No chest pain orthopnea or PND. She denies any palpitations or any dizzy spells. She walks on a regular basis. At the time of my evaluation, the patient is alert awake oriented and in no distress.  Past Medical History:  Diagnosis Date  . A-fib ([place])   . Acquired hypothyroidism 12/03/2015   Last Assessment & Plan:  Formatting of this note might be different from the original. Relevant Hx: Course: Daily Update: Today's Plan:this needs to be checked  Electronically signed by: Mayer Camel, NP 03/19/16 [phone]  . Age-related osteoporosis without current pathological fracture 12/03/2015   Formatting of this note might be different from the original. REcommend meds based on Dexa 03/2020.  Last Assessment & Plan:  Formatting of this note might be different from the original. Relevant Hx: Course: Daily Update: Today's Plan:increase her vitamin d  Electronically signed by: Mayer Camel, NP 03/19/16 [phone]  . Anxiety 10/21/2017   Formatting of this note might be different from the original. mild  . Arrhythmia   . Dyspnea on exertion 03/19/2016   Last Assessment & Plan:  Formatting of this note might be different from the original. Relevant Hx: Course: Daily Update: Today's Plan:100% pulse ox reading on RA resting, EKG done today and she is going to be referred back to cardiology to ensure that all stable for her with  the dyspnea and her known arrythymia  Electronically signed by: Mayer Camel, NP 03/19/16 [phone]  . Dysuria 09/09/2018  . Encounter for current long-term use of anticoagulants 12/03/2015   Last Assessment & Plan:  Relevant Hx: Course: Daily Update: Today's Plan:return for her protime  Electronically signed by: Mayer Camel, NP 12/03/15 [phone]  Last Assessment & Plan:   Formatting of this note might be different from the original. Relevant Hx: Course: Daily Update: Today's Plan:return for her protime  Electronically signed by: Mayer Camel, NP 12/03/15 [phone]  . Essential (primary) hypertension 12/03/2015   Last Assessment & Plan:  Relevant Hx: Course: Daily Update: Today's Plan:this is stable for her overall  Electronically signed by: Mayer Camel, NP 03/19/16 [phone]  Last Assessment & Plan:  Formatting of this note might be different from the original. Relevant Hx: Course: Daily Update: Today's Plan:this is stable for her overall  Electronically signed by: Mayer Camel, NP  . Gastro-esophageal reflux disease with esophagitis 12/03/2015   Last Assessment & Plan:  Relevant Hx: Course: Daily Update: Today's Plan:this is stable for her  Electronically signed by: Mayer Camel, NP 03/19/16 [phone]  Last Assessment & Plan:  Formatting of this note might be different from the original. Relevant Hx: Course: Daily Update: Today's Plan:this is stable for her  Electronically signed by: Mayer Camel, NP 03/19/16 [phone]  . Hair loss 10/02/2016  . High risk medication use 10/21/2017  . Local edema 10/02/2016  . Malaise and fatigue 05/23/2020  . Need for influenza vaccination 09/09/2018  . Need for pneumococcal vaccination 09/09/2018  . On amiodarone therapy 01/24/2020  . Osteopenia of multiple sites 12/03/2015   Last Assessment & Plan:  Relevant Hx: Course: Daily Update: Today's Plan:increase her vitamin d  Electronically signed by: Mayer Camel, NP 03/19/16 [phone]  Last Assessment & Plan:  Formatting of this note might be different from the original. Relevant Hx: Course: Daily Update: Today's Plan:increase her vitamin d  Electronically signed by: Mayer Camel, NP 03/19/16 [phone]  . Paroxysmal atrial fibrillation ([place]) 12/03/2015   Last Assessment & Plan:  Formatting of this note might be different from  the original. Relevant Hx: Course: Daily Update: Today's Plan:slight increase in SOB and concerning for her and she is going to be referred [facility]  Electronically signed by: Mayer Camel, NP 03/19/16 0930  . Perennial allergic rhinitis 12/03/2015   Last Assessment & Plan:  Relevant Hx: Course: Daily Update: Today's Plan:discussed this in depth and can randomly take this   Electronically signed by: Mayer Camel, NP 03/19/16 [phone]  Last Assessment & Plan:  Formatting of this note might be different from the original. Relevant Hx: Course: Daily Update: Today's Plan:discussed this in depth and can randomly take this   Electronically  . Primary insomnia 10/21/2017  . Primary osteoarthritis involving multiple joints 12/03/2015   Last Assessment & Plan:  Relevant Hx: Course: Daily Update: Today's Plan:she feels that this is stable for her overall  Electronically signed by: Mayer Camel, NP 03/19/16 [phone]  Last Assessment & Plan:  Formatting of this note might be different from the original. Relevant Hx: Course: Daily Update: Today's Plan:she feels that this is stable for her overall  Electronically signed by:   Marland Kitchen Pure hypercholesterolemia 12/03/2015   Last Assessment & Plan:  Relevant Hx: Course: Daily Update: Today's Plan:this is to be checked fasting  Electronically signed by: Mayer Camel, NP 03/19/16 [phone]  Last  Assessment & Plan:  Formatting of this note might be different from the original. Relevant Hx: Course: Daily Update: Today's Plan:this is to be checked fasting  Electronically signed by: Mayer Camel, NP 0  . Rash of face 12/15/2017  . Stress reaction, acute, predominant emotional 05/02/2020   Formatting of this note might be different from the original. Son with recent CVA  . Swelling of face 12/15/2017  . Thrombocytopenia ([place]) 10/22/2018  . Urge incontinence 03/19/2016   Last Assessment & Plan:  Relevant Hx: Course: Daily Update:  Today's Plan:detrol will be tried for this  Electronically signed by: Mayer Camel, NP 03/19/16 0931  Last Assessment & Plan:  Formatting of this note might be different from the original. Relevant Hx: Course: Daily Update: Today's Plan:detrol will be tried for this  Electronically signed by: Mayer Camel, NP 0  . UTI symptoms 09/09/2018    Past Surgical History:  Procedure Laterality Date  . ABDOMINAL HYSTERECTOMY    . VARICOSE VEIN SURGERY      Current Medications: Current Meds  Medication Sig  . Cholecalciferol (VITAMIN D3) 1000 units CAPS Take 3,000 Units by mouth daily.  Marland Kitchen levothyroxine (SYNTHROID) 50 MCG tablet Take 50 mcg by mouth daily.  Marland Kitchen losartan (COZAAR) 50 MG tablet Take 25 mg by mouth daily.  . metoprolol tartrate (LOPRESSOR) 25 MG tablet Take 1/2 (one-half) tablet by mouth twice daily  . Multiple Vitamins-Minerals (ICAPS AREDS 2 PO) Take 2 capsules by mouth daily.  Marland Kitchen omeprazole (PRILOSEC) 20 MG capsule Take 20 mg by mouth daily.  Marland Kitchen PACERONE 100 MG tablet Take 1/2 (one-half) tablet by mouth once daily  . warfarin (COUMADIN) 5 MG tablet Take one by mouth on Sunday. 1/2 tab on other days. Adjust per INR.     Allergies:   Bacitracin, Cefdinir, Tetracyclines & related, Erythromycin base, and Tetracycline   Social History   Socioeconomic History  . Marital status: Married    Spouse name: Not on file  . Number of children: Not on file  . Years of education: Not on file  . Highest education level: Not on file  Occupational History  . Not on file  Tobacco Use  . Smoking status: Former Smoker    Packs/day: 0.50    Years: 15.00    Pack years: 7.50    Types: Cigarettes    Quit date: 1970    Years since quitting: 51.8  . Smokeless tobacco: Never Used  Vaping Use  . Vaping Use: Never used  Substance and Sexual Activity  . Alcohol use: No  . Drug use: No  . Sexual activity: Not on file  Other Topics Concern  . Not on file  Social History  Narrative  . Not on file   Social Determinants of Health   Financial Resource Strain:   . Difficulty of Paying Living Expenses: Not on file  Food Insecurity:   . Worried About Charity fundraiser in the Last Year: Not on file  . Ran Out of Food in the Last Year: Not on file  Transportation Needs:   . Lack of Transportation (Medical): Not on file  . Lack of Transportation (Non-Medical): Not on file  Physical Activity:   . Days of Exercise per Week: Not on file  . Minutes of Exercise per Session: Not on file  Stress:   . Feeling of Stress : Not on file  Social Connections:   . Frequency of Communication with Friends and Family: Not on file  .  Frequency of Social Gatherings with Friends and Family: Not on file  . Attends Religious Services: Not on file  . Active Member of Clubs or Organizations: Not on file  . Attends Archivist Meetings: Not on file  . Marital Status: Not on file     Family History: The patient's family history includes Diabetes in her mother, sister, and son; Heart attack in her father and mother; Stroke in her father and mother.  ROS:   Please see the history of present illness.    All other systems reviewed and are negative.  EKGs/Labs/Other Studies Reviewed:    The following studies were reviewed today: EKG was reviewed and reveals sinus rhythm and nonspecific ST-T changes   Recent Labs: 01/24/2020: ALT 13; BUN 14; Creatinine, Ser 0.77; Hemoglobin 14.2; Platelets 149; Potassium 4.4; Sodium 140; TSH 2.470  Recent Lipid Panel    Component Value Date/Time   CHOL 224 (H) 01/24/2020 1010   TRIG 119 01/24/2020 1010   HDL 66 01/24/2020 1010   CHOLHDL 3.4 01/24/2020 1010   LDLCALC 137 (H) 01/24/2020 1010    Physical Exam:    VS:  BP (!) 154/74   Pulse 61   Ht 5\' 2"  (1.575 m)   Wt 132 lb 9.6 oz (60.1 kg)   SpO2 98%   BMI 24.25 kg/m     Wt Readings from Last 3 Encounters:  08/02/20 132 lb 9.6 oz (60.1 kg)  01/24/20 133 lb 9.6 oz (60.6  kg)  06/23/19 132 lb (59.9 kg)     GEN: Patient is in no acute distress HEENT: Normal NECK: No JVD; No carotid bruits LYMPHATICS: No lymphadenopathy CARDIAC: Hear sounds regular, 2/6 systolic murmur at the apex. RESPIRATORY:  Clear to auscultation without rales, wheezing or rhonchi  ABDOMEN: Soft, non-tender, non-distended MUSCULOSKELETAL:  No edema; No deformity  SKIN: Warm and dry NEUROLOGIC:  Alert and oriented x 3 PSYCHIATRIC:  Normal affect   Signed, Jenean Lindau, MD  08/02/2020 10:56 AM    [place]

## 2020-08-02 NOTE — Telephone Encounter (Signed)
4 bottles of Xarelto given to pt. [id number] 05/2022

## 2020-08-02 NOTE — Patient Instructions (Signed)
Medication Instructions:  Your physician has recommended you make the following change in your medication:   Once we review your labs we will advise you on the Xarelto.  *If you need a refill on your cardiac medications before your next appointment, please call your pharmacy*   Lab Work: Your physician recommends that you have a PT and INR done today.  If you have labs (blood work) drawn today and your tests are completely normal, you will receive your results only by: Marland Kitchen MyChart Message (if you have MyChart) OR . A paper copy in the mail If you have any lab test that is abnormal or we need to change your treatment, we will call you to review the results.   Testing/Procedures: Your physician has requested that you have an echocardiogram. Echocardiography is a painless test that uses sound waves to create images of your heart. It provides your doctor with information about the size and shape of your heart and how well your heart's chambers and valves are working. This procedure takes approximately one hour. There are no restrictions for this procedure.     Follow-Up: At [facility], you and your health needs are our priority.  As part of our continuing mission to provide you with exceptional heart care, we have created designated Provider Care Teams.  These Care Teams include your primary Cardiologist (physician) and Advanced Practice Providers (APPs -  Physician Assistants and Nurse Practitioners) who all work together to provide you with the care you need, when you need it.  We recommend signing up for the patient portal called "MyChart".  Sign up information is provided on this After Visit Summary.  MyChart is used to connect with patients for Virtual Visits (Telemedicine).  Patients are able to view lab/test results, encounter notes, upcoming appointments, etc.  Non-urgent messages can be sent to your provider as well.   To learn more about what you can do with MyChart, go to  [email or web address].    Your next appointment:   6 month(s)  The format for your next appointment:   In Person  Provider:   Jyl Heinz, MD   Other Instructions Rivaroxaban oral tablets What is this medicine? RIVAROXABAN (ri va ROX a ban) is an anticoagulant (blood thinner). It is used to treat blood clots in the lungs or in the veins. It is also used to prevent blood clots in the lungs or in the veins. It is also used to lower the chance of stroke in people with a medical condition called atrial fibrillation. This medicine may be used for other purposes; ask your health care provider or pharmacist if you have questions. COMMON BRAND NAME(S): Xarelto, Xarelto Starter Pack What should I tell my health care provider before I take this medicine? They need to know if you have any of these conditions:  antiphospholipid antibody syndrome  artificial heart valve  bleeding disorders  bleeding in the brain  blood in your stools (black or tarry stools) or if you have blood in your vomit  history of blood clots  history of stomach bleeding  kidney disease  liver disease  low blood counts, like low white cell, platelet, or red cell counts  recent or planned spinal or epidural procedure  take medicines that treat or prevent blood clots  an unusual or allergic reaction to rivaroxaban, other medicines, foods, dyes, or preservatives  pregnant or trying to get pregnant  breast-feeding How should I use this medicine? Take this medicine by mouth with a glass  of water. Follow the directions on the prescription label. Take your medicine at regular intervals. Do not take it more often than directed. Do not stop taking except on your doctor's advice. Stopping this medicine may increase your risk of a blood clot. Be sure to refill your prescription before you run out of medicine. If you are taking this medicine after hip or knee replacement surgery, take it with or without food. If  you are taking this medicine for atrial fibrillation, take it with your evening meal. If you are taking this medicine to treat blood clots, take it with food at the same time each day. If you are unable to swallow your tablet, you may crush the tablet and mix it in applesauce. Then, immediately eat the applesauce. You should eat more food right after you eat the applesauce containing the crushed tablet. Talk to your pediatrician regarding the use of this medicine in children. Special care may be needed. Overdosage: If you think you have taken too much of this medicine contact a poison control center or emergency room at once. NOTE: This medicine is only for you. Do not share this medicine with others. What if I miss a dose? If you take your medicine once a day and miss a dose, take the missed dose as soon as you remember. If it is almost time for your next dose, take only that dose. Do not take double or extra doses. If you take your medicine twice a day and miss a dose, take the missed dose immediately. In this instance, 2 tablets may be taken at the same time. The next day you should take 1 tablet twice a day as directed. What may interact with this medicine? Do not take this medicine with any of the following medications:  defibrotide This medicine may also interact with the following medications:  aspirin and aspirin-like medicines  certain antibiotics like erythromycin, azithromycin, and clarithromycin  certain medicines for fungal infections like ketoconazole and itraconazole  certain medicines for irregular heart beat like amiodarone, quinidine, dronedarone  certain medicines for seizures like carbamazepine, phenytoin  certain medicines that treat or prevent blood clots like warfarin, enoxaparin, and dalteparin  conivaptan  felodipine  indinavir  lopinavir; ritonavir  NSAIDS, medicines for pain and inflammation, like ibuprofen or  naproxen  ranolazine  rifampin  ritonavir  SNRIs, medicines for depression, like desvenlafaxine, duloxetine, levomilnacipran, venlafaxine  SSRIs, medicines for depression, like citalopram, escitalopram, fluoxetine, fluvoxamine, paroxetine, sertraline  St. John's wort  verapamil This list may not describe all possible interactions. Give your health care provider a list of all the medicines, herbs, non-prescription drugs, or dietary supplements you use. Also tell them if you smoke, drink alcohol, or use illegal drugs. Some items may interact with your medicine. What should I watch for while using this medicine? Visit your healthcare professional for regular checks on your progress. You may need blood work done while you are taking this medicine. Your condition will be monitored carefully while you are receiving this medicine. It is important not to miss any appointments. Avoid sports and activities that might cause injury while you are using this medicine. Severe falls or injuries can cause unseen bleeding. Be careful when using sharp tools or knives. Consider using an Copy. Take special care brushing or flossing your teeth. Report any injuries, bruising, or red spots on the skin to your healthcare professional. If you are going to need surgery or other procedure, tell your healthcare professional that you are taking  this medicine. Wear a medical ID bracelet or chain. Carry a card that describes your disease and details of your medicine and dosage times. What side effects may I notice from receiving this medicine? Side effects that you should report to your doctor or health care professional as soon as possible:  allergic reactions like skin rash, itching or hives, swelling of the face, lips, or tongue  back pain  redness, blistering, peeling or loosening of the skin, including inside the mouth  signs and symptoms of bleeding such as bloody or black, tarry stools; red or  dark-brown urine; spitting up blood or brown material that looks like coffee grounds; red spots on the skin; unusual bruising or bleeding from the eye, gums, or nose  signs and symptoms of a blood clot such as chest pain; shortness of breath; pain, swelling, or warmth in the leg  signs and symptoms of a stroke such as changes in vision; confusion; trouble speaking or understanding; severe headaches; sudden numbness or weakness of the face, arm or leg; trouble walking; dizziness; loss of coordination Side effects that usually do not require medical attention (report to your doctor or health care professional if they continue or are bothersome):  dizziness  muscle pain This list may not describe all possible side effects. Call your doctor for medical advice about side effects. You may report side effects to FDA at 1-800-FDA-1088. Where should I keep my medicine? Keep out of the reach of children. Store at room temperature between 15 and 30 degrees C (59 and 86 degrees F). Throw away any unused medicine after the expiration date. NOTE: This sheet is a summary. It may not cover all possible information. If you have questions about this medicine, talk to your doctor, pharmacist, or health care provider.  2020 Elsevier/Gold Standard (2018-12-20 09:45:59)

## 2020-08-03 LAB — PROTIME-INR
INR: 2.7 — ABNORMAL HIGH (ref 0.9–1.2)
Prothrombin Time: 27.1 s — ABNORMAL HIGH (ref 9.1–12.0)

## 2020-08-17 ENCOUNTER — Other Ambulatory Visit: Payer: PPO

## 2020-08-21 ENCOUNTER — Telehealth: Payer: Self-pay | Admitting: Cardiology

## 2020-08-21 ENCOUNTER — Other Ambulatory Visit: Payer: PPO

## 2020-08-21 NOTE — Telephone Encounter (Signed)
Christine Baker is calling requesting the patient's Echo order be faxed over. The fax number is [phone]. Please advise.

## 2020-09-10 DIAGNOSIS — E039 Hypothyroidism, unspecified: Secondary | ICD-10-CM | POA: Diagnosis not present

## 2020-09-10 DIAGNOSIS — E538 Deficiency of other specified B group vitamins: Secondary | ICD-10-CM | POA: Diagnosis not present

## 2020-09-17 ENCOUNTER — Other Ambulatory Visit: Payer: Self-pay | Admitting: Cardiology

## 2020-10-11 DIAGNOSIS — E538 Deficiency of other specified B group vitamins: Secondary | ICD-10-CM | POA: Diagnosis not present

## 2020-10-11 DIAGNOSIS — Z7901 Long term (current) use of anticoagulants: Secondary | ICD-10-CM | POA: Diagnosis not present

## 2020-11-13 DIAGNOSIS — Z7901 Long term (current) use of anticoagulants: Secondary | ICD-10-CM | POA: Diagnosis not present

## 2020-11-13 DIAGNOSIS — E538 Deficiency of other specified B group vitamins: Secondary | ICD-10-CM | POA: Diagnosis not present

## 2020-12-11 DIAGNOSIS — E538 Deficiency of other specified B group vitamins: Secondary | ICD-10-CM | POA: Diagnosis not present

## 2020-12-11 DIAGNOSIS — I480 Paroxysmal atrial fibrillation: Secondary | ICD-10-CM | POA: Diagnosis not present

## 2020-12-11 DIAGNOSIS — I10 Essential (primary) hypertension: Secondary | ICD-10-CM | POA: Diagnosis not present

## 2020-12-11 DIAGNOSIS — E039 Hypothyroidism, unspecified: Secondary | ICD-10-CM | POA: Diagnosis not present

## 2020-12-11 DIAGNOSIS — G629 Polyneuropathy, unspecified: Secondary | ICD-10-CM | POA: Diagnosis not present

## 2020-12-11 DIAGNOSIS — Z7901 Long term (current) use of anticoagulants: Secondary | ICD-10-CM | POA: Diagnosis not present

## 2020-12-17 DIAGNOSIS — E538 Deficiency of other specified B group vitamins: Secondary | ICD-10-CM | POA: Diagnosis not present

## 2021-01-08 ENCOUNTER — Other Ambulatory Visit: Payer: Self-pay | Admitting: Cardiology

## 2021-01-16 DIAGNOSIS — E538 Deficiency of other specified B group vitamins: Secondary | ICD-10-CM | POA: Diagnosis not present

## 2021-01-16 DIAGNOSIS — Z7901 Long term (current) use of anticoagulants: Secondary | ICD-10-CM | POA: Diagnosis not present

## 2021-01-30 ENCOUNTER — Ambulatory Visit: Payer: PPO | Admitting: Cardiology

## 2021-02-19 DIAGNOSIS — Z7901 Long term (current) use of anticoagulants: Secondary | ICD-10-CM | POA: Diagnosis not present

## 2021-02-25 DIAGNOSIS — Z7901 Long term (current) use of anticoagulants: Secondary | ICD-10-CM | POA: Diagnosis not present

## 2021-02-25 DIAGNOSIS — M810 Age-related osteoporosis without current pathological fracture: Secondary | ICD-10-CM | POA: Diagnosis not present

## 2021-02-25 DIAGNOSIS — I480 Paroxysmal atrial fibrillation: Secondary | ICD-10-CM | POA: Diagnosis not present

## 2021-02-25 DIAGNOSIS — K2100 Gastro-esophageal reflux disease with esophagitis, without bleeding: Secondary | ICD-10-CM | POA: Diagnosis not present

## 2021-02-25 DIAGNOSIS — R5381 Other malaise: Secondary | ICD-10-CM | POA: Diagnosis not present

## 2021-02-25 DIAGNOSIS — F419 Anxiety disorder, unspecified: Secondary | ICD-10-CM | POA: Diagnosis not present

## 2021-02-25 DIAGNOSIS — D696 Thrombocytopenia, unspecified: Secondary | ICD-10-CM | POA: Diagnosis not present

## 2021-02-25 DIAGNOSIS — E039 Hypothyroidism, unspecified: Secondary | ICD-10-CM | POA: Diagnosis not present

## 2021-02-25 DIAGNOSIS — M159 Polyosteoarthritis, unspecified: Secondary | ICD-10-CM | POA: Diagnosis not present

## 2021-02-25 DIAGNOSIS — I10 Essential (primary) hypertension: Secondary | ICD-10-CM | POA: Diagnosis not present

## 2021-02-25 DIAGNOSIS — Z0000 Encounter for general adult medical examination without abnormal findings: Secondary | ICD-10-CM | POA: Diagnosis not present

## 2021-02-25 DIAGNOSIS — R5383 Other fatigue: Secondary | ICD-10-CM | POA: Diagnosis not present

## 2021-02-25 DIAGNOSIS — E7800 Pure hypercholesterolemia, unspecified: Secondary | ICD-10-CM | POA: Diagnosis not present

## 2021-02-27 DIAGNOSIS — L853 Xerosis cutis: Secondary | ICD-10-CM | POA: Diagnosis not present

## 2021-02-27 DIAGNOSIS — L814 Other melanin hyperpigmentation: Secondary | ICD-10-CM | POA: Diagnosis not present

## 2021-02-27 DIAGNOSIS — L820 Inflamed seborrheic keratosis: Secondary | ICD-10-CM | POA: Diagnosis not present

## 2021-02-28 ENCOUNTER — Telehealth: Payer: Self-pay | Admitting: Cardiology

## 2021-02-28 NOTE — Telephone Encounter (Signed)
PT RETURNING PHONE CALL FOR DR. Geraldo Pitter, EDU PT CALL WAS AN APPT REMINDER

## 2021-03-01 ENCOUNTER — Ambulatory Visit: Payer: PPO | Admitting: Cardiology

## 2021-03-01 ENCOUNTER — Encounter: Payer: Self-pay | Admitting: Cardiology

## 2021-03-01 ENCOUNTER — Other Ambulatory Visit: Payer: Self-pay

## 2021-03-01 VITALS — BP 146/74 | HR 65 | Ht 63.6 in | Wt 135.8 lb

## 2021-03-01 DIAGNOSIS — I10 Essential (primary) hypertension: Secondary | ICD-10-CM

## 2021-03-01 DIAGNOSIS — I480 Paroxysmal atrial fibrillation: Secondary | ICD-10-CM | POA: Diagnosis not present

## 2021-03-01 DIAGNOSIS — Z79899 Other long term (current) drug therapy: Secondary | ICD-10-CM | POA: Diagnosis not present

## 2021-03-01 NOTE — Patient Instructions (Signed)

## 2021-03-01 NOTE — Progress Notes (Signed)
Cardiology Office Note:    Date:  03/01/2021   ID:  Christine Baker, DOB [birth year], MRN [id number]  PCP:  Raina Mina., MD  Cardiologist:  Jenean Lindau, MD   Referring MD: Raina Mina., MD    ASSESSMENT:    1. Paroxysmal atrial fibrillation (HCC)   2. Essential (primary) hypertension   3. On amiodarone therapy    PLAN:    In order of problems listed above:  1. Paroxysmal atrial fibrillation:I discussed with the patient atrial fibrillation, disease process. Management and therapy including rate and rhythm control, anticoagulation benefits and potential risks were discussed extensively with the patient. Patient had multiple questions which were answered to patient's satisfaction.  Patient takes amiodarone therapy.  Patient is also taking anticoagulation with warfarin and I suggested newer anticoagulants and she is agreeable.  We will see if she will qualify for patient assistance program. 2. Essential hypertension: Blood pressure stable and diet was emphasized.  Patient was advised to walk at least 30 minutes a day 5 days a week and she promises to do so. 3. She is going to have blood work done with her primary care provider in the next few days.Patient will be seen in follow-up appointment in 6 months or earlier if the patient has any concerns    Medication Adjustments/Labs and Tests Ordered: Current medicines are reviewed at length with the patient today.  Concerns regarding medicines are outlined above.  No orders of the defined types were placed in this encounter.  No orders of the defined types were placed in this encounter.    No chief complaint on file.    History of Present Illness:    Christine Baker is a 86 y.o. female.  Patient has past medical history of paroxysmal fibrillation, essential hypertension.  She denies any problems at this time and takes care of activities of daily living.  She walks on a regular basis.  At the time of my evaluation, the patient is  alert awake oriented and in no distress.  Past Medical History:  Diagnosis Date  . A-fib ([place])   . Acquired hypothyroidism 12/03/2015   Last Assessment & Plan:  Formatting of this note might be different from the original. Relevant Hx: Course: Daily Update: Today's Plan:this needs to be checked  Electronically signed by: Mayer Camel, NP 03/19/16 [phone]  . Age-related osteoporosis without current pathological fracture 12/03/2015   Formatting of this note might be different from the original. REcommend meds based on Dexa 03/2020.  Last Assessment & Plan:  Formatting of this note might be different from the original. Relevant Hx: Course: Daily Update: Today's Plan:increase her vitamin d  Electronically signed by: Mayer Camel, NP 03/19/16 [phone]  . Anxiety 10/21/2017   Formatting of this note might be different from the original. mild  . Arrhythmia   . Dyspnea on exertion 03/19/2016   Last Assessment & Plan:  Formatting of this note might be different from the original. Relevant Hx: Course: Daily Update: Today's Plan:100% pulse ox reading on RA resting, EKG done today and she is going to be referred back to cardiology to ensure that all stable for her with the dyspnea and her known arrythymia  Electronically signed by: Mayer Camel, NP 03/19/16 [phone]  . Dysuria 09/09/2018  . Encounter for current long-term use of anticoagulants 12/03/2015   Last Assessment & Plan:  Relevant Hx: Course: Daily Update: Today's Plan:return for her protime  Electronically signed by: Mayer Camel,  NP 12/03/15 0851  Last Assessment & Plan:  Formatting of this note might be different from the original. Relevant Hx: Course: Daily Update: Today's Plan:return for her protime  Electronically signed by: Mayer Camel, NP 12/03/15 [phone]  . Essential (primary) hypertension 12/03/2015   Last Assessment & Plan:  Relevant Hx: Course: Daily Update: Today's Plan:this is stable for  her overall  Electronically signed by: Mayer Camel, NP 03/19/16 [phone]  Last Assessment & Plan:  Formatting of this note might be different from the original. Relevant Hx: Course: Daily Update: Today's Plan:this is stable for her overall  Electronically signed by: Mayer Camel, NP  . Gastro-esophageal reflux disease with esophagitis 12/03/2015   Last Assessment & Plan:  Relevant Hx: Course: Daily Update: Today's Plan:this is stable for her  Electronically signed by: Mayer Camel, NP 03/19/16 [phone]  Last Assessment & Plan:  Formatting of this note might be different from the original. Relevant Hx: Course: Daily Update: Today's Plan:this is stable for her  Electronically signed by: Mayer Camel, NP 03/19/16 [phone]  . Hair loss 10/02/2016  . High risk medication use 10/21/2017  . Local edema 10/02/2016  . Malaise and fatigue 05/23/2020  . Need for influenza vaccination 09/09/2018  . Need for pneumococcal vaccination 09/09/2018  . On amiodarone therapy 01/24/2020  . Osteopenia of multiple sites 12/03/2015   Last Assessment & Plan:  Relevant Hx: Course: Daily Update: Today's Plan:increase her vitamin d  Electronically signed by: Mayer Camel, NP 03/19/16 [phone]  Last Assessment & Plan:  Formatting of this note might be different from the original. Relevant Hx: Course: Daily Update: Today's Plan:increase her vitamin d  Electronically signed by: Mayer Camel, NP 03/19/16 [phone]  . Paroxysmal atrial fibrillation ([place]) 12/03/2015   Last Assessment & Plan:  Formatting of this note might be different from the original. Relevant Hx: Course: Daily Update: Today's Plan:slight increase in SOB and concerning for her and she is going to be referred back to [facility]  Electronically signed by: Mayer Camel, NP 03/19/16 0930  . Perennial allergic rhinitis 12/03/2015   Last Assessment & Plan:  Relevant Hx: Course: Daily Update: Today's  Plan:discussed this in depth and can randomly take this   Electronically signed by: Mayer Camel, NP 03/19/16 [phone]  Last Assessment & Plan:  Formatting of this note might be different from the original. Relevant Hx: Course: Daily Update: Today's Plan:discussed this in depth and can randomly take this   Electronically  . Primary insomnia 10/21/2017  . Primary osteoarthritis involving multiple joints 12/03/2015   Last Assessment & Plan:  Relevant Hx: Course: Daily Update: Today's Plan:she feels that this is stable for her overall  Electronically signed by: Mayer Camel, NP 03/19/16 [phone]  Last Assessment & Plan:  Formatting of this note might be different from the original. Relevant Hx: Course: Daily Update: Today's Plan:she feels that this is stable for her overall  Electronically signed by:   Marland Kitchen Pure hypercholesterolemia 12/03/2015   Last Assessment & Plan:  Relevant Hx: Course: Daily Update: Today's Plan:this is to be checked fasting  Electronically signed by: Mayer Camel, NP 03/19/16 [phone]  Last Assessment & Plan:  Formatting of this note might be different from the original. Relevant Hx: Course: Daily Update: Today's Plan:this is to be checked fasting  Electronically signed by: Mayer Camel, NP 0  . Rash of face 12/15/2017  . Stress reaction, acute, predominant emotional 05/02/2020   Formatting of this  note might be different from the original. Son with recent CVA  . Swelling of face 12/15/2017  . Thrombocytopenia ([place]) 10/22/2018  . Urge incontinence 03/19/2016   Last Assessment & Plan:  Relevant Hx: Course: Daily Update: Today's Plan:detrol will be tried for this  Electronically signed by: Mayer Camel, NP 03/19/16 0931  Last Assessment & Plan:  Formatting of this note might be different from the original. Relevant Hx: Course: Daily Update: Today's Plan:detrol will be tried for this  Electronically signed by: Mayer Camel, NP  0  . UTI symptoms 09/09/2018    Past Surgical History:  Procedure Laterality Date  . ABDOMINAL HYSTERECTOMY    . VARICOSE VEIN SURGERY      Current Medications: Current Meds  Medication Sig  . amiodarone (PACERONE) 100 MG tablet Take 50 mg by mouth daily.  . Cholecalciferol (VITAMIN D3) 1000 units CAPS Take 3,000 Units by mouth daily.  Marland Kitchen levothyroxine (SYNTHROID) 50 MCG tablet Take 50 mcg by mouth daily.  Marland Kitchen losartan (COZAAR) 50 MG tablet Take 25 mg by mouth daily.  . metoprolol tartrate (LOPRESSOR) 25 MG tablet Take 1/2 (one-half) tablet by mouth twice daily  . Multiple Vitamins-Minerals (ICAPS AREDS 2 PO) Take 2 capsules by mouth daily.  Marland Kitchen omeprazole (PRILOSEC) 20 MG capsule Take 20 mg by mouth daily.  Marland Kitchen triamcinolone cream (KENALOG) 0.1 % Apply 1 application topically 2 (two) times daily as needed for rash.  . warfarin (COUMADIN) 5 MG tablet Take 5 mg by mouth once a week. On Sunday and take 2.5 mg everyday other than Sunday.     Allergies:   Bacitracin, Cefdinir, Tetracyclines & related, Erythromycin base, and Tetracycline   Social History   Socioeconomic History  . Marital status: Married    Spouse name: Not on file  . Number of children: Not on file  . Years of education: Not on file  . Highest education level: Not on file  Occupational History  . Not on file  Tobacco Use  . Smoking status: Former Smoker    Packs/day: 0.50    Years: 15.00    Pack years: 7.50    Types: Cigarettes    Quit date: 1970    Years since quitting: 52.4  . Smokeless tobacco: Never Used  Vaping Use  . Vaping Use: Never used  Substance and Sexual Activity  . Alcohol use: No  . Drug use: No  . Sexual activity: Not on file  Other Topics Concern  . Not on file  Social History Narrative  . Not on file   Social Determinants of Health   Financial Resource Strain: Not on file  Food Insecurity: Not on file  Transportation Needs: Not on file  Physical Activity: Not on file  Stress: Not on  file  Social Connections: Not on file     Family History: The patient's family history includes Diabetes in her mother, sister, and son; Heart attack in her father and mother; Stroke in her father and mother.  ROS:   Please see the history of present illness.    All other systems reviewed and are negative.  EKGs/Labs/Other Studies Reviewed:    The following studies were reviewed today: I discussed my findings with the patient in extensive length.   Recent Labs: No results found for requested labs within last 8760 hours.  Recent Lipid Panel    Component Value Date/Time   CHOL 224 (H) 01/24/2020 1010   TRIG 119 01/24/2020 1010   HDL 66 01/24/2020  1010   CHOLHDL 3.4 01/24/2020 1010   LDLCALC 137 (H) 01/24/2020 1010    Physical Exam:    VS:  BP (!) 146/74   Pulse 65   Ht 5' 3.6" (1.615 m)   Wt 135 lb 12.8 oz (61.6 kg)   SpO2 97%   BMI 23.60 kg/m     Wt Readings from Last 3 Encounters:  03/01/21 135 lb 12.8 oz (61.6 kg)  08/02/20 132 lb 9.6 oz (60.1 kg)  01/24/20 133 lb 9.6 oz (60.6 kg)     GEN: Patient is in no acute distress HEENT: Normal NECK: No JVD; No carotid bruits LYMPHATICS: No lymphadenopathy CARDIAC: Hear sounds regular, 2/6 systolic murmur at the apex. RESPIRATORY:  Clear to auscultation without rales, wheezing or rhonchi  ABDOMEN: Soft, non-tender, non-distended MUSCULOSKELETAL:  No edema; No deformity  SKIN: Warm and dry NEUROLOGIC:  Alert and oriented x 3 PSYCHIATRIC:  Normal affect   Signed, Jenean Lindau, MD  03/01/2021 2:37 PM    [facility]

## 2021-03-25 DIAGNOSIS — I10 Essential (primary) hypertension: Secondary | ICD-10-CM | POA: Diagnosis not present

## 2021-03-25 DIAGNOSIS — N3941 Urge incontinence: Secondary | ICD-10-CM | POA: Diagnosis not present

## 2021-03-25 DIAGNOSIS — Z7901 Long term (current) use of anticoagulants: Secondary | ICD-10-CM | POA: Diagnosis not present

## 2021-03-25 DIAGNOSIS — D696 Thrombocytopenia, unspecified: Secondary | ICD-10-CM | POA: Diagnosis not present

## 2021-03-25 DIAGNOSIS — E7800 Pure hypercholesterolemia, unspecified: Secondary | ICD-10-CM | POA: Diagnosis not present

## 2021-03-25 DIAGNOSIS — E538 Deficiency of other specified B group vitamins: Secondary | ICD-10-CM | POA: Diagnosis not present

## 2021-03-25 DIAGNOSIS — E039 Hypothyroidism, unspecified: Secondary | ICD-10-CM | POA: Diagnosis not present

## 2021-04-24 ENCOUNTER — Other Ambulatory Visit: Payer: Self-pay | Admitting: Cardiology

## 2021-04-24 DIAGNOSIS — E538 Deficiency of other specified B group vitamins: Secondary | ICD-10-CM | POA: Diagnosis not present

## 2021-04-24 DIAGNOSIS — L814 Other melanin hyperpigmentation: Secondary | ICD-10-CM | POA: Diagnosis not present

## 2021-04-24 DIAGNOSIS — L853 Xerosis cutis: Secondary | ICD-10-CM | POA: Diagnosis not present

## 2021-04-24 DIAGNOSIS — B351 Tinea unguium: Secondary | ICD-10-CM | POA: Diagnosis not present

## 2021-04-24 DIAGNOSIS — L820 Inflamed seborrheic keratosis: Secondary | ICD-10-CM | POA: Diagnosis not present

## 2021-04-24 NOTE — Telephone Encounter (Signed)
Metoprolol tartrate 25 mg # 90 x 1 refill sent to [facility], [place]

## 2021-05-01 DIAGNOSIS — R42 Dizziness and giddiness: Secondary | ICD-10-CM | POA: Diagnosis not present

## 2021-05-01 DIAGNOSIS — R5383 Other fatigue: Secondary | ICD-10-CM | POA: Diagnosis not present

## 2021-05-01 DIAGNOSIS — I10 Essential (primary) hypertension: Secondary | ICD-10-CM | POA: Diagnosis not present

## 2021-05-01 DIAGNOSIS — Z20828 Contact with and (suspected) exposure to other viral communicable diseases: Secondary | ICD-10-CM | POA: Diagnosis not present

## 2021-05-01 DIAGNOSIS — I480 Paroxysmal atrial fibrillation: Secondary | ICD-10-CM | POA: Diagnosis not present

## 2021-05-01 DIAGNOSIS — Z7901 Long term (current) use of anticoagulants: Secondary | ICD-10-CM | POA: Diagnosis not present

## 2021-05-01 DIAGNOSIS — E039 Hypothyroidism, unspecified: Secondary | ICD-10-CM | POA: Diagnosis not present

## 2021-05-01 DIAGNOSIS — R5381 Other malaise: Secondary | ICD-10-CM | POA: Diagnosis not present

## 2021-05-01 DIAGNOSIS — R051 Acute cough: Secondary | ICD-10-CM | POA: Diagnosis not present

## 2021-05-07 DIAGNOSIS — H04123 Dry eye syndrome of bilateral lacrimal glands: Secondary | ICD-10-CM | POA: Diagnosis not present

## 2021-05-07 DIAGNOSIS — H5203 Hypermetropia, bilateral: Secondary | ICD-10-CM | POA: Diagnosis not present

## 2021-05-07 DIAGNOSIS — H353132 Nonexudative age-related macular degeneration, bilateral, intermediate dry stage: Secondary | ICD-10-CM | POA: Diagnosis not present

## 2021-05-07 DIAGNOSIS — Z961 Presence of intraocular lens: Secondary | ICD-10-CM | POA: Diagnosis not present

## 2021-05-15 MED ORDER — APIXABAN 5 MG PO TABS: 5.0000 mg | ORAL_TABLET | Freq: Two times a day (BID) | ORAL | 3 refills | Status: DC

## 2021-05-15 NOTE — Addendum Note (Signed)
Addended by: Truddie Hidden on: 05/15/2021 02:53 PM   Modules accepted: Orders

## 2021-05-22 DIAGNOSIS — E039 Hypothyroidism, unspecified: Secondary | ICD-10-CM | POA: Diagnosis not present

## 2021-05-28 DIAGNOSIS — E538 Deficiency of other specified B group vitamins: Secondary | ICD-10-CM | POA: Diagnosis not present

## 2021-06-07 ENCOUNTER — Telehealth: Payer: Self-pay | Admitting: Cardiology

## 2021-06-07 NOTE — Telephone Encounter (Signed)
Christine Baker is calling from [employer] patient assistance fund, they received the paperwork  from our office but Dr. Geraldo Pitter put the month and date but he did not put the year so paperwork needs to be updated and re faxed.   Fax# [phone]

## 2021-06-12 ENCOUNTER — Other Ambulatory Visit: Payer: Self-pay | Admitting: Cardiology

## 2021-06-12 NOTE — Telephone Encounter (Signed)
Re-faxed.

## 2021-06-12 NOTE — Telephone Encounter (Signed)
Pacerone 100 mg # 45 x 3 sent to   [place], [place]

## 2021-07-01 DIAGNOSIS — E538 Deficiency of other specified B group vitamins: Secondary | ICD-10-CM | POA: Diagnosis not present

## 2021-07-01 DIAGNOSIS — E039 Hypothyroidism, unspecified: Secondary | ICD-10-CM | POA: Diagnosis not present

## 2021-08-24 DIAGNOSIS — Z20828 Contact with and (suspected) exposure to other viral communicable diseases: Secondary | ICD-10-CM | POA: Diagnosis not present

## 2021-08-24 DIAGNOSIS — R051 Acute cough: Secondary | ICD-10-CM | POA: Diagnosis not present

## 2021-08-24 DIAGNOSIS — R0981 Nasal congestion: Secondary | ICD-10-CM | POA: Diagnosis not present

## 2021-08-24 DIAGNOSIS — R509 Fever, unspecified: Secondary | ICD-10-CM | POA: Diagnosis not present

## 2021-08-24 DIAGNOSIS — J069 Acute upper respiratory infection, unspecified: Secondary | ICD-10-CM | POA: Diagnosis not present

## 2021-09-03 DIAGNOSIS — E538 Deficiency of other specified B group vitamins: Secondary | ICD-10-CM | POA: Diagnosis not present

## 2021-09-03 DIAGNOSIS — M159 Polyosteoarthritis, unspecified: Secondary | ICD-10-CM | POA: Diagnosis not present

## 2021-09-03 DIAGNOSIS — D696 Thrombocytopenia, unspecified: Secondary | ICD-10-CM | POA: Diagnosis not present

## 2021-09-03 DIAGNOSIS — I480 Paroxysmal atrial fibrillation: Secondary | ICD-10-CM | POA: Diagnosis not present

## 2021-09-03 DIAGNOSIS — E039 Hypothyroidism, unspecified: Secondary | ICD-10-CM | POA: Diagnosis not present

## 2021-09-03 DIAGNOSIS — Z23 Encounter for immunization: Secondary | ICD-10-CM | POA: Diagnosis not present

## 2021-09-03 DIAGNOSIS — I10 Essential (primary) hypertension: Secondary | ICD-10-CM | POA: Diagnosis not present

## 2021-09-03 DIAGNOSIS — R300 Dysuria: Secondary | ICD-10-CM | POA: Diagnosis not present

## 2021-09-13 DIAGNOSIS — N390 Urinary tract infection, site not specified: Secondary | ICD-10-CM | POA: Diagnosis not present

## 2021-09-18 DIAGNOSIS — H04123 Dry eye syndrome of bilateral lacrimal glands: Secondary | ICD-10-CM | POA: Diagnosis not present

## 2021-09-18 DIAGNOSIS — H5203 Hypermetropia, bilateral: Secondary | ICD-10-CM | POA: Diagnosis not present

## 2021-09-18 DIAGNOSIS — Z961 Presence of intraocular lens: Secondary | ICD-10-CM | POA: Diagnosis not present

## 2021-09-18 DIAGNOSIS — H353132 Nonexudative age-related macular degeneration, bilateral, intermediate dry stage: Secondary | ICD-10-CM | POA: Diagnosis not present

## 2021-10-16 ENCOUNTER — Other Ambulatory Visit: Payer: Self-pay

## 2021-10-22 ENCOUNTER — Other Ambulatory Visit: Payer: Self-pay

## 2021-10-22 ENCOUNTER — Ambulatory Visit: Payer: Medicare HMO | Admitting: Cardiology

## 2021-10-22 ENCOUNTER — Encounter: Payer: Self-pay | Admitting: Cardiology

## 2021-10-22 VITALS — BP 146/76 | HR 71 | Ht 63.6 in | Wt 135.2 lb

## 2021-10-22 DIAGNOSIS — E039 Hypothyroidism, unspecified: Secondary | ICD-10-CM

## 2021-10-22 DIAGNOSIS — I480 Paroxysmal atrial fibrillation: Secondary | ICD-10-CM | POA: Diagnosis not present

## 2021-10-22 DIAGNOSIS — Z79899 Other long term (current) drug therapy: Secondary | ICD-10-CM

## 2021-10-22 DIAGNOSIS — I10 Essential (primary) hypertension: Secondary | ICD-10-CM

## 2021-10-22 NOTE — Progress Notes (Signed)
Cardiology Office Note:    Date:  10/22/2021   ID:  Christine Baker, DOB 11/12/1934, MRN [id number]  PCP:  Raina Mina., MD  Cardiologist:  Jenean Lindau, MD   Referring MD: Raina Mina., MD    ASSESSMENT:    1. Paroxysmal atrial fibrillation (HCC)   2. Essential (primary) hypertension   3. On amiodarone therapy   4. Acquired hypothyroidism    PLAN:    In order of problems listed above:  Secondary prevention stressed with the patient.  Importance of compliance with diet medication stressed and she vocalized understanding.  She was advised to walk at least half an hour 5 days a week and she promises to do so. Paroxysmal atrial fibrillation:I discussed with the patient atrial fibrillation, disease process. Management and therapy including rate and rhythm control, anticoagulation benefits and potential risks were discussed extensively with the patient. Patient had multiple questions which were answered to patient's satisfaction. Essential hypertension: Blood pressure stable and diet was emphasized.  Lifestyle modification urged.  Blood pressures are better at home. Hypothyroidism: Managed by primary care.  I reviewed her TSH and also discussed amiodarone issues and she understands Patient will be seen in follow-up appointment in 6 months or earlier if the patient has any concerns    Medication Adjustments/Labs and Tests Ordered: Current medicines are reviewed at length with the patient today.  Concerns regarding medicines are outlined above.  No orders of the defined types were placed in this encounter.  No orders of the defined types were placed in this encounter.    No chief complaint on file.    History of Present Illness:    Christine Baker is a 87 y.o. female.  Patient has past medical history of paroxysmal atrial fibrillation, essential hypertension and hypothyroidism.  She denies any problems at this time and takes care of activities of daily living.  No chest pain  orthopnea or PND.  At the time of my evaluation, the patient is alert awake oriented and in no distress.  Past Medical History:  Diagnosis Date   A-fib [facility])    Acquired hypothyroidism 12/03/2015   Last Assessment & Plan:  Formatting of this note might be different from the original. Relevant Hx: Course: Daily Update: Today's Plan:this needs to be checked  Electronically signed by: Mayer Camel, NP 03/19/16 [phone]   Age-related osteoporosis without current pathological fracture 12/03/2015   Formatting of this note might be different from the original. REcommend meds based on Dexa 03/2020.  Last Assessment & Plan:  Formatting of this note might be different from the original. Relevant Hx: Course: Daily Update: Today's Plan:increase her vitamin d  Electronically signed by: Mayer Camel, NP 03/19/16 [phone]   Anxiety 10/21/2017   Formatting of this note might be different from the original. mild   Arrhythmia    Dyspnea on exertion 03/19/2016   Last Assessment & Plan:  Formatting of this note might be different from the original. Relevant Hx: Course: Daily Update: Today's Plan:100% pulse ox reading on RA resting, EKG done today and she is going to be referred back to cardiology to ensure that all stable for her with the dyspnea and her known arrythymia  Electronically signed by: Mayer Camel, NP 03/19/16 [phone]   Dysuria 09/09/2018   Encounter for current long-term use of anticoagulants 12/03/2015   Last Assessment & Plan:  Relevant Hx: Course: Daily Update: Today's Plan:return for her protime  Electronically signed by: Mayer Camel,  NP 12/03/15 0851  Last Assessment & Plan:  Formatting of this note might be different from the original. Relevant Hx: Course: Daily Update: Today's Plan:return for her protime  Electronically signed by: Mayer Camel, NP 12/03/15 [phone]   Essential (primary) hypertension 12/03/2015   Last Assessment & Plan:  Relevant Hx:  Course: Daily Update: Today's Plan:this is stable for her overall  Electronically signed by: Mayer Camel, NP 03/19/16 [phone]  Last Assessment & Plan:  Formatting of this note might be different from the original. Relevant Hx: Course: Daily Update: Today's Plan:this is stable for her overall  Electronically signed by: Mayer Camel, NP   Gastro-esophageal reflux disease with esophagitis 12/03/2015   Last Assessment & Plan:  Relevant Hx: Course: Daily Update: Today's Plan:this is stable for her  Electronically signed by: Mayer Camel, NP 03/19/16 [phone]  Last Assessment & Plan:  Formatting of this note might be different from the original. Relevant Hx: Course: Daily Update: Today's Plan:this is stable for her  Electronically signed by: Mayer Camel, NP 03/19/16 0928   Hair loss 10/02/2016   High risk medication use 10/21/2017   Local edema 10/02/2016   Malaise and fatigue 05/23/2020   Need for influenza vaccination 09/09/2018   Need for pneumococcal vaccination 09/09/2018   On amiodarone therapy 01/24/2020   Osteopenia of multiple sites 12/03/2015   Last Assessment & Plan:  Relevant Hx: Course: Daily Update: Today's Plan:increase her vitamin d  Electronically signed by: Mayer Camel, NP 03/19/16 [phone]  Last Assessment & Plan:  Formatting of this note might be different from the original. Relevant Hx: Course: Daily Update: Today's Plan:increase her vitamin d  Electronically signed by: Mayer Camel, NP 03/19/16 [phone]   Paroxysmal atrial fibrillation ([place]) 12/03/2015   Last Assessment & Plan:  Formatting of this note might be different from the original. Relevant Hx: Course: Daily Update: Today's Plan:slight increase in SOB and concerning for her and she is going to be [facility] to [facility]  Electronically signed by: Mayer Camel, NP 03/19/16 0930   Perennial allergic rhinitis 12/03/2015   Last Assessment & Plan:  Relevant  Hx: Course: Daily Update: Today's Plan:discussed this in depth and can randomly take this   Electronically signed by: Mayer Camel, NP 03/19/16 [phone]  Last Assessment & Plan:  Formatting of this note might be different from the original. Relevant Hx: Course: Daily Update: Today's Plan:discussed this in depth and can randomly take this   Electronically   Primary insomnia 10/21/2017   Primary osteoarthritis involving multiple joints 12/03/2015   Last Assessment & Plan:  Relevant Hx: Course: Daily Update: Today's Plan:she feels that this is stable for her overall  Electronically signed by: Mayer Camel, NP 03/19/16 [phone]  Last Assessment & Plan:  Formatting of this note might be different from the original. Relevant Hx: Course: Daily Update: Today's Plan:she feels that this is stable for her overall  Electronically signed by:    Pure hypercholesterolemia 12/03/2015   Last Assessment & Plan:  Relevant Hx: Course: Daily Update: Today's Plan:this is to be checked fasting  Electronically signed by: Mayer Camel, NP 03/19/16 [phone]  Last Assessment & Plan:  Formatting of this note might be different from the original. Relevant Hx: Course: Daily Update: Today's Plan:this is to be checked fasting  Electronically signed by: Mayer Camel, NP 0   Rash of face 12/15/2017   Stress reaction, acute, predominant emotional 05/02/2020   Formatting of this  note might be different from the original. Son with recent CVA   Swelling of face 12/15/2017   Thrombocytopenia ([place]) 10/22/2018   Urge incontinence 03/19/2016   Last Assessment & Plan:  Relevant Hx: Course: Daily Update: Today's Plan:detrol will be tried for this  Electronically signed by: Mayer Camel, NP 03/19/16 0931  Last Assessment & Plan:  Formatting of this note might be different from the original. Relevant Hx: Course: Daily Update: Today's Plan:detrol will be tried for this  Electronically signed by:  Mayer Camel, NP 0   UTI symptoms 09/09/2018    Past Surgical History:  Procedure Laterality Date   ABDOMINAL HYSTERECTOMY     VARICOSE VEIN SURGERY      Current Medications: Current Meds  Medication Sig   apixaban (ELIQUIS) 5 MG TABS tablet Take 1 tablet (5 mg total) by mouth 2 (two) times daily.   Cholecalciferol (VITAMIN D3) 1000 units CAPS Take 3,000 Units by mouth daily.   fluticasone (FLONASE) 50 MCG/ACT nasal spray Place 1 spray into both nostrils as needed for allergies or rhinitis.   levothyroxine (SYNTHROID) 112 MCG tablet Take 56 mcg by mouth daily.   losartan (COZAAR) 50 MG tablet Take 25 mg by mouth daily.   metoprolol tartrate (LOPRESSOR) 25 MG tablet TAKE 1/2 TABLET BY MOUTH TWICE DAILY   Multiple Vitamins-Minerals (ICAPS AREDS 2 PO) Take 2 capsules by mouth daily.   omeprazole (PRILOSEC) 20 MG capsule Take 20 mg by mouth daily.   PACERONE 100 MG tablet TAKE 1/2 TABLET BY MOUTH ONCE DAILY     Allergies:   Bacitracin, Cefdinir, Tetracyclines & related, Erythromycin base, and Tetracycline   Social History   Socioeconomic History   Marital status: Married    Spouse name: Not on file   Number of children: Not on file   Years of education: Not on file   Highest education level: Not on file  Occupational History   Not on file  Tobacco Use   Smoking status: Former    Packs/day: 0.50    Years: 15.00    Pack years: 7.50    Types: Cigarettes    Quit date: 1970    Years since quitting: 53.0   Smokeless tobacco: Never  Vaping Use   Vaping Use: Never used  Substance and Sexual Activity   Alcohol use: No   Drug use: No   Sexual activity: Not on file  Other Topics Concern   Not on file  Social History Narrative   Not on file   Social Determinants of Health   Financial Resource Strain: Not on file  Food Insecurity: Not on file  Transportation Needs: Not on file  Physical Activity: Not on file  Stress: Not on file  Social Connections: Not on  file     Family History: The patient's family history includes Diabetes in her mother, sister, and son; Heart attack in her father and mother; Stroke in her father and mother.  ROS:   Please see the history of present illness.    All other systems reviewed and are negative.  EKGs/Labs/Other Studies Reviewed:    The following studies were reviewed today: I discussed my findings with the patient at length and EKG reveals sinus rhythm and nonspecific ST-T changes   Recent Labs: No results found for requested labs within last 8760 hours.  Recent Lipid Panel    Component Value Date/Time   CHOL 224 (H) 01/24/2020 1010   TRIG 119 01/24/2020 1010   HDL 66  01/24/2020 1010   CHOLHDL 3.4 01/24/2020 1010   LDLCALC 137 (H) 01/24/2020 1010    Physical Exam:    VS:  BP (!) 146/76    Pulse 71    Ht 5' 3.6" (1.615 m)    Wt 135 lb 3.2 oz (61.3 kg)    SpO2 95%    BMI 23.50 kg/m     Wt Readings from Last 3 Encounters:  10/22/21 135 lb 3.2 oz (61.3 kg)  03/01/21 135 lb 12.8 oz (61.6 kg)  08/02/20 132 lb 9.6 oz (60.1 kg)     GEN: Patient is in no acute distress HEENT: Normal NECK: No JVD; No carotid bruits LYMPHATICS: No lymphadenopathy CARDIAC: Hear sounds regular, 2/6 systolic murmur at the apex. RESPIRATORY:  Clear to auscultation without rales, wheezing or rhonchi  ABDOMEN: Soft, non-tender, non-distended MUSCULOSKELETAL:  No edema; No deformity  SKIN: Warm and dry NEUROLOGIC:  Alert and oriented x 3 PSYCHIATRIC:  Normal affect   Signed, Jenean Lindau, MD  10/22/2021 2:03 PM    [place]

## 2021-10-22 NOTE — Patient Instructions (Signed)

## 2021-10-23 ENCOUNTER — Other Ambulatory Visit: Payer: Self-pay | Admitting: Cardiology

## 2022-03-17 DIAGNOSIS — M19049 Primary osteoarthritis, unspecified hand: Secondary | ICD-10-CM | POA: Insufficient documentation

## 2022-03-17 DIAGNOSIS — M65312 Trigger thumb, left thumb: Secondary | ICD-10-CM | POA: Insufficient documentation

## 2022-03-17 DIAGNOSIS — G5602 Carpal tunnel syndrome, left upper limb: Secondary | ICD-10-CM

## 2022-03-17 HISTORY — DX: Carpal tunnel syndrome, left upper limb: G56.02

## 2022-03-17 HISTORY — DX: Primary osteoarthritis, unspecified hand: M19.049

## 2022-03-17 HISTORY — DX: Trigger thumb, left thumb: M65.312

## 2022-04-09 ENCOUNTER — Telehealth: Payer: Self-pay

## 2022-04-09 NOTE — Telephone Encounter (Signed)
Provided 3 week of samples and BM application. Patient aware complete patient information sections and consent form and to bring back with  a copy of proof of income. Aware not to complete th prescriber's portion.

## 2022-04-16 ENCOUNTER — Telehealth: Payer: Self-pay

## 2022-04-16 NOTE — Telephone Encounter (Signed)
Pt assistance for Eliquis has been received and will be faxed unpon Dr. Geraldo Pitter signing.

## 2022-06-04 ENCOUNTER — Other Ambulatory Visit: Payer: Self-pay | Admitting: Cardiology

## 2022-07-03 ENCOUNTER — Other Ambulatory Visit: Payer: Self-pay | Admitting: Cardiology

## 2022-07-03 DIAGNOSIS — I480 Paroxysmal atrial fibrillation: Secondary | ICD-10-CM

## 2022-07-04 NOTE — Telephone Encounter (Signed)
Eliquis '5mg'$  refill request received. Patient is 87 years old, weight-61.3kg, Crea-0.88 on 03/11/2022 via [place] at [facility], and last seen by Dr. Geraldo Pitter on 10/22/2021. Dose is appropriate based on dosing criteria. Will send in refill to requested pharmacy.

## 2022-08-18 ENCOUNTER — Ambulatory Visit: Payer: Medicare HMO | Admitting: Cardiology

## 2022-08-20 ENCOUNTER — Other Ambulatory Visit: Payer: Self-pay

## 2022-08-21 ENCOUNTER — Encounter: Payer: Self-pay | Admitting: Cardiology

## 2022-08-21 ENCOUNTER — Ambulatory Visit: Payer: Medicare HMO | Attending: Cardiology | Admitting: Cardiology

## 2022-08-21 VITALS — BP 124/64 | HR 64 | Ht 63.6 in | Wt 133.4 lb

## 2022-08-21 DIAGNOSIS — I480 Paroxysmal atrial fibrillation: Secondary | ICD-10-CM

## 2022-08-21 DIAGNOSIS — E7800 Pure hypercholesterolemia, unspecified: Secondary | ICD-10-CM | POA: Diagnosis not present

## 2022-08-21 DIAGNOSIS — I10 Essential (primary) hypertension: Secondary | ICD-10-CM

## 2022-08-21 NOTE — Patient Instructions (Signed)
Medication Instructions:  Your physician has recommended you make the following change in your medication:   Stop your Losartan.  *If you need a refill on your cardiac medications before your next appointment, please call your pharmacy*   Lab Work: Your physician recommends that you have labs done in the office today. Your test included  basic metabolic panel, complete blood count, TSH and  liver function.  If you have labs (blood work) drawn today and your tests are completely normal, you will receive your results only by: [place] (if you have MyChart) OR A paper copy in the mail If you have any lab test that is abnormal or we need to change your treatment, we will call you to review the results.   Testing/Procedures: Your physician has requested that you have an echocardiogram. Echocardiography is a painless test that uses sound waves to create images of your heart. It provides your doctor with information about the size and shape of your heart and how well your heart's chambers and valves are working. This procedure takes approximately one hour. There are no restrictions for this procedure.    Follow-Up: At [facility], you and your health needs are our priority.  As part of our continuing mission to provide you with exceptional heart care, we have created designated Provider Care Teams.  These Care Teams include your primary Cardiologist (physician) and Advanced Practice Providers (APPs -  Physician Assistants and Nurse Practitioners) who all work together to provide you with the care you need, when you need it.  We recommend signing up for the patient portal called "MyChart".  Sign up information is provided on this After Visit Summary.  MyChart is used to connect with patients for Virtual Visits (Telemedicine).  Patients are able to view lab/test results, encounter notes, upcoming appointments, etc.  Non-urgent messages can be sent to your provider as well.   To learn more  about what you can do with MyChart, go to [email or web address].    Your next appointment:   9 month(s)  The format for your next appointment:   In Person  Provider:   Jyl Heinz, MD   Other Instructions Echocardiogram An echocardiogram is a test that uses sound waves (ultrasound) to produce images of the heart. Images from an echocardiogram can provide important information about: Heart size and shape. The size and thickness and movement of your heart's walls. Heart muscle function and strength. Heart valve function or if you have stenosis. Stenosis is when the heart valves are too narrow. If blood is flowing backward through the heart valves (regurgitation). A tumor or infectious growth around the heart valves. Areas of heart muscle that are not working well because of poor blood flow or injury from a heart attack. Aneurysm detection. An aneurysm is a weak or damaged part of an artery wall. The wall bulges out from the normal force of blood pumping through the body. Tell a health care provider about: Any allergies you have. All medicines you are taking, including vitamins, herbs, eye drops, creams, and over-the-counter medicines. Any blood disorders you have. Any surgeries you have had. Any medical conditions you have. Whether you are pregnant or may be pregnant. What are the risks? Generally, this is a safe test. However, problems may occur, including an allergic reaction to dye (contrast) that may be used during the test. What happens before the test? No specific preparation is needed. You may eat and drink normally. What happens during the test? You will take off  your clothes from the waist up and put on a hospital gown. Electrodes or electrocardiogram (ECG)patches may be placed on your chest. The electrodes or patches are then connected to a device that monitors your heart rate and rhythm. You will lie down on a table for an ultrasound exam. A gel will be applied to  your chest to help sound waves pass through your skin. A handheld device, called a transducer, will be pressed against your chest and moved over your heart. The transducer produces sound waves that travel to your heart and bounce back (or "echo" back) to the transducer. These sound waves will be captured in real-time and changed into images of your heart that can be viewed on a video monitor. The images will be recorded on a computer and reviewed by your health care provider. You may be asked to change positions or hold your breath for a short time. This makes it easier to get different views or better views of your heart. In some cases, you may receive contrast through an IV in one of your veins. This can improve the quality of the pictures from your heart. The procedure may vary among health care providers and hospitals.   What can I expect after the test? You may return to your normal, everyday life, including diet, activities, and medicines, unless your health care provider tells you not to do that. Follow these instructions at home: It is up to you to get the results of your test. Ask your health care provider, or the department that is doing the test, when your results will be ready. Keep all follow-up visits. This is important. Summary An echocardiogram is a test that uses sound waves (ultrasound) to produce images of the heart. Images from an echocardiogram can provide important information about the size and shape of your heart, heart muscle function, heart valve function, and other possible heart problems. You do not need to do anything to prepare before this test. You may eat and drink normally. After the echocardiogram is completed, you may return to your normal, everyday life, unless your health care provider tells you not to do that. This information is not intended to replace advice given to you by your health care provider. Make sure you discuss any questions you have with your health  care provider. Document Revised: 05/15/2020 Document Reviewed: 05/15/2020 Elsevier Patient Education  2021 [employer].

## 2022-08-21 NOTE — Progress Notes (Signed)
Cardiology Office Note:    Date:  08/21/2022   ID:  Christine Baker, DOB 03-07-1935, MRN [id number]  PCP:  Raina Mina., MD  Cardiologist:  Jenean Lindau, MD   Referring MD: Raina Mina., MD    ASSESSMENT:    1. Essential (primary) hypertension   2. Paroxysmal atrial fibrillation (HCC)   3. Pure hypercholesterolemia    PLAN:    In order of problems listed above:  Primary prevention stressed to the patient.  Importance of compliance with diet medication stressed and she vocalized understanding. Paroxysmal atrial fibrillation:I discussed with the patient atrial fibrillation, disease process. Management and therapy including rate and rhythm control, anticoagulation benefits and potential risks were discussed extensively with the patient. Patient had multiple questions which were answered to patient's satisfaction. Orthostatic hypotension: In view of this have discontinued losartan.  She will give [place] a call in a week to tell us how she is feeling.  We wil adjust medications accordingly. Amiodarone therapy: She is on a small dose but we will do baseline blood work today. Patient will be seen in follow-up appointment in 6 months or earlier if the patient has any concerns    Medication Adjustments/Labs and Tests Ordered: Current medicines are reviewed at length with the patient today.  Concerns regarding medicines are outlined above.  No orders of the defined types were placed in this encounter.  No orders of the defined types were placed in this encounter.    No chief complaint on file.    History of Present Illness:    Christine Baker is a 87 y.o. female.  Patient has past medical history of essential hypertension, paroxysmal atrial fibrillation.  She denies any problems at this time and takes care of activities of daily living.  No chest pain orthopnea or PND  Her only issue is of feeling a little lightheaded in the morning when she sits up.  At the time of my evaluation,  the patient is alert awake oriented and in no distress.  Overall she leads a sedentary lifestyle and ambulates appropriate for age.  Past Medical History:  Diagnosis Date   A-fib [facility])    Acquired hypothyroidism 12/03/2015   Last Assessment & Plan:  Formatting of this note might be different from the original. Relevant Hx: Course: Daily Update: Today's Plan:this needs to be checked  Electronically signed by: Mayer Camel, NP 03/19/16 [phone]   Age-related osteoporosis without current pathological fracture 12/03/2015   Formatting of this note might be different from the original. REcommend meds based on Dexa 03/2020.  Last Assessment & Plan:  Formatting of this note might be different from the original. Relevant Hx: Course: Daily Update: Today's Plan:increase her vitamin d  Electronically signed by: Mayer Camel, NP 03/19/16 [phone]   Anxiety 10/21/2017   Formatting of this note might be different from the original. mild   Arrhythmia    Arthritis of hand 03/17/2022   Carpal tunnel syndrome of left wrist 03/17/2022   Dyspnea on exertion 03/19/2016   Last Assessment & Plan:  Formatting of this note might be different from the original. Relevant Hx: Course: Daily Update: Today's Plan:100% pulse ox reading on RA resting, EKG done today and she is going to be referred back to cardiology to ensure that all stable for her with the dyspnea and her known arrythymia  Electronically signed by: Mayer Camel, NP 03/19/16 0938   Dysuria 09/09/2018   Encounter for current long-term use of anticoagulants  12/03/2015   Last Assessment & Plan:  Relevant Hx: Course: Daily Update: Today's Plan:return for her protime  Electronically signed by: Mayer Camel, NP 12/03/15 [phone]  Last Assessment & Plan:  Formatting of this note might be different from the original. Relevant Hx: Course: Daily Update: Today's Plan:return for her protime  Electronically signed by: Mayer Camel, NP 12/03/15 [phone]   Essential (primary) hypertension 12/03/2015   Last Assessment & Plan:  Relevant Hx: Course: Daily Update: Today's Plan:this is stable for her overall  Electronically signed by: Mayer Camel, NP 03/19/16 [phone]  Last Assessment & Plan:  Formatting of this note might be different from the original. Relevant Hx: Course: Daily Update: Today's Plan:this is stable for her overall  Electronically signed by: Mayer Camel, NP   Gastro-esophageal reflux disease with esophagitis 12/03/2015   Last Assessment & Plan:  Relevant Hx: Course: Daily Update: Today's Plan:this is stable for her  Electronically signed by: Mayer Camel, NP 03/19/16 [phone]  Last Assessment & Plan:  Formatting of this note might be different from the original. Relevant Hx: Course: Daily Update: Today's Plan:this is stable for her  Electronically signed by: Mayer Camel, NP 03/19/16 0928   Hair loss 10/02/2016   High risk medication use 10/21/2017   Local edema 10/02/2016   Malaise and fatigue 05/23/2020   Need for influenza vaccination 09/09/2018   Need for pneumococcal vaccination 09/09/2018   On amiodarone therapy 01/24/2020   Osteopenia of multiple sites 12/03/2015   Last Assessment & Plan:  Relevant Hx: Course: Daily Update: Today's Plan:increase her vitamin d  Electronically signed by: Mayer Camel, NP 03/19/16 [phone]  Last Assessment & Plan:  Formatting of this note might be different from the original. Relevant Hx: Course: Daily Update: Today's Plan:increase her vitamin d  Electronically signed by: Mayer Camel, NP 03/19/16 [phone]   Paroxysmal atrial fibrillation ([place]) 12/03/2015   Last Assessment & Plan:  Formatting of this note might be different from the original. Relevant Hx: Course: Daily Update: Today's Plan:slight increase in SOB and concerning for her and she is going to be referred [facility] to [facility]  Electronically signed  by: Mayer Camel, NP 03/19/16 0930   Perennial allergic rhinitis 12/03/2015   Last Assessment & Plan:  Relevant Hx: Course: Daily Update: Today's Plan:discussed this in depth and can randomly take this   Electronically signed by: Mayer Camel, NP 03/19/16 [phone]  Last Assessment & Plan:  Formatting of this note might be different from the original. Relevant Hx: Course: Daily Update: Today's Plan:discussed this in depth and can randomly take this   Electronically   Primary insomnia 10/21/2017   Primary osteoarthritis involving multiple joints 12/03/2015   Last Assessment & Plan:  Relevant Hx: Course: Daily Update: Today's Plan:she feels that this is stable for her overall  Electronically signed by: Mayer Camel, NP 03/19/16 [phone]  Last Assessment & Plan:  Formatting of this note might be different from the original. Relevant Hx: Course: Daily Update: Today's Plan:she feels that this is stable for her overall  Electronically signed by:    Pure hypercholesterolemia 12/03/2015   Last Assessment & Plan:  Relevant Hx: Course: Daily Update: Today's Plan:this is to be checked fasting  Electronically signed by: Mayer Camel, NP 03/19/16 [phone]  Last Assessment & Plan:  Formatting of this note might be different from the original. Relevant Hx: Course: Daily Update: Today's Plan:this is to be checked fasting  Electronically signed  by: Mayer Camel, NP 0   Rash of face 12/15/2017   Stress reaction, acute, predominant emotional 05/02/2020   Formatting of this note might be different from the original. Son with recent CVA   Swelling of face 12/15/2017   Thrombocytopenia ([place]) 10/22/2018   Trigger thumb of left hand 03/17/2022   Urge incontinence 03/19/2016   Last Assessment & Plan:  Relevant Hx: Course: Daily Update: Today's Plan:detrol will be tried for this  Electronically signed by: Mayer Camel, NP 03/19/16 0931  Last Assessment &  Plan:  Formatting of this note might be different from the original. Relevant Hx: Course: Daily Update: Today's Plan:detrol will be tried for this  Electronically signed by: Mayer Camel, NP 0   UTI symptoms 09/09/2018    Past Surgical History:  Procedure Laterality Date   ABDOMINAL HYSTERECTOMY     VARICOSE VEIN SURGERY      Current Medications: Current Meds  Medication Sig   apixaban (ELIQUIS) 5 MG TABS tablet Take 1 tablet by mouth twice daily   Cholecalciferol (VITAMIN D3) 1000 units CAPS Take 3,000 Units by mouth daily.   fluticasone (FLONASE) 50 MCG/ACT nasal spray Place 1 spray into both nostrils as needed for allergies or rhinitis.   levothyroxine (SYNTHROID) 112 MCG tablet Take 56 mcg by mouth daily.   losartan (COZAAR) 50 MG tablet Take 25 mg by mouth daily.   metoprolol tartrate (LOPRESSOR) 25 MG tablet Take 1/2 (one-half) tablet by mouth twice daily   Multiple Vitamins-Minerals (ICAPS AREDS 2 PO) Take 2 capsules by mouth daily.   omeprazole (PRILOSEC) 20 MG capsule Take 20 mg by mouth daily.   PACERONE 100 MG tablet Take 1/2 (one-half) tablet by mouth once daily   RESTASIS 0.05 % ophthalmic emulsion Place 1 drop into both eyes as needed (dry eyes).   sertraline (ZOLOFT) 50 MG tablet Take 50 mg by mouth daily.     Allergies:   Bacitracin, Cefdinir, Orphenadrine-aspirin-caffeine, Tetracyclines & related, Erythromycin base, and Tetracycline   Social History   Socioeconomic History   Marital status: Married    Spouse name: Not on file   Number of children: Not on file   Years of education: Not on file   Highest education level: Not on file  Occupational History   Not on file  Tobacco Use   Smoking status: Former    Packs/day: 0.50    Years: 15.00    Total pack years: 7.50    Types: Cigarettes    Quit date: 1970    Years since quitting: 53.9   Smokeless tobacco: Never  Vaping Use   Vaping Use: Never used  Substance and Sexual Activity   Alcohol  use: No   Drug use: No   Sexual activity: Not on file  Other Topics Concern   Not on file  Social History Narrative   Not on file   Social Determinants of Health   Financial Resource Strain: Not on file  Food Insecurity: Not on file  Transportation Needs: Not on file  Physical Activity: Not on file  Stress: Not on file  Social Connections: Not on file     Family History: The patient's family history includes Diabetes in her mother, sister, and son; Heart attack in her father and mother; Stroke in her father and mother.  ROS:   Please see the history of present illness.    All other systems reviewed and are negative.  EKGs/Labs/Other Studies Reviewed:    The following studies  were reviewed today: EKG reveals sinus rhythm and nonspecific ST-T changes   Recent Labs: No results found for requested labs within last 365 days.  Recent Lipid Panel    Component Value Date/Time   CHOL 224 (H) 01/24/2020 1010   TRIG 119 01/24/2020 1010   HDL 66 01/24/2020 1010   CHOLHDL 3.4 01/24/2020 1010   LDLCALC 137 (H) 01/24/2020 1010    Physical Exam:    VS:  BP 124/64   Pulse 64   Ht 5' 3.6" (1.615 m)   Wt 133 lb 6.4 oz (60.5 kg)   SpO2 95%   BMI 23.19 kg/m     Wt Readings from Last 3 Encounters:  08/21/22 133 lb 6.4 oz (60.5 kg)  10/22/21 135 lb 3.2 oz (61.3 kg)  03/01/21 135 lb 12.8 oz (61.6 kg)     GEN: Patient is in no acute distress HEENT: Normal NECK: No JVD; No carotid bruits LYMPHATICS: No lymphadenopathy CARDIAC: Hear sounds regular, 2/6 systolic murmur at the apex. RESPIRATORY:  Clear to auscultation without rales, wheezing or rhonchi  ABDOMEN: Soft, non-tender, non-distended MUSCULOSKELETAL:  No edema; No deformity  SKIN: Warm and dry NEUROLOGIC:  Alert and oriented x 3 PSYCHIATRIC:  Normal affect   Signed, Jenean Lindau, MD  08/21/2022 1:53 PM     [facility]

## 2022-08-22 LAB — CBC
Hematocrit: 37.7 % (ref 34.0–46.6)
Hemoglobin: 12.8 g/dL (ref 11.1–15.9)
MCH: 31.2 pg (ref 26.6–33.0)
MCHC: 34 g/dL (ref 31.5–35.7)
MCV: 92 fL (ref 79–97)
Platelets: 155 10*3/uL (ref 150–450)
RBC: 4.1 x10E6/uL (ref 3.77–5.28)
RDW: 12.8 % (ref 11.7–15.4)
WBC: 5.1 10*3/uL (ref 3.4–10.8)

## 2022-08-22 LAB — HEPATIC FUNCTION PANEL
ALT: 16 IU/L (ref 0–32)
AST: 25 IU/L (ref 0–40)
Albumin: 4.4 g/dL (ref 3.7–4.7)
Alkaline Phosphatase: 93 IU/L (ref 44–121)
Bilirubin Total: 0.5 mg/dL (ref 0.0–1.2)
Bilirubin, Direct: 0.17 mg/dL (ref 0.00–0.40)
Total Protein: 6.6 g/dL (ref 6.0–8.5)

## 2022-08-22 LAB — BASIC METABOLIC PANEL
BUN/Creatinine Ratio: 20 (ref 12–28)
BUN: 17 mg/dL (ref 8–27)
CO2: 25 mmol/L (ref 20–29)
Calcium: 9.7 mg/dL (ref 8.7–10.3)
Chloride: 100 mmol/L (ref 96–106)
Creatinine, Ser: 0.84 mg/dL (ref 0.57–1.00)
Glucose: 89 mg/dL (ref 70–99)
Potassium: 4.7 mmol/L (ref 3.5–5.2)
Sodium: 139 mmol/L (ref 134–144)
eGFR: 68 mL/min/{1.73_m2} (ref >59)

## 2022-08-22 LAB — TSH: TSH: 5.27 u[IU]/mL — ABNORMAL HIGH (ref 0.450–4.500)

## 2022-08-26 ENCOUNTER — Ambulatory Visit: Payer: Medicare HMO | Attending: Cardiology

## 2022-08-26 ENCOUNTER — Ambulatory Visit: Payer: Medicare HMO

## 2022-08-26 DIAGNOSIS — I480 Paroxysmal atrial fibrillation: Secondary | ICD-10-CM | POA: Diagnosis not present

## 2022-08-26 LAB — ECHOCARDIOGRAM COMPLETE
Area-P 1/2: 5.13 cm2
MV M vel: 4.63 m/s
MV Peak grad: 85.7 mmHg
S' Lateral: 2.5 cm

## 2022-09-15 DIAGNOSIS — R7303 Prediabetes: Secondary | ICD-10-CM

## 2022-09-15 HISTORY — DX: Prediabetes: R73.03

## 2022-09-26 ENCOUNTER — Other Ambulatory Visit: Payer: Self-pay | Admitting: Cardiology

## 2022-10-21 ENCOUNTER — Other Ambulatory Visit: Payer: Self-pay | Admitting: Cardiology

## 2023-03-24 DIAGNOSIS — F330 Major depressive disorder, recurrent, mild: Secondary | ICD-10-CM

## 2023-03-24 HISTORY — DX: Major depressive disorder, recurrent, mild: F33.0

## 2023-04-14 ENCOUNTER — Other Ambulatory Visit: Payer: Self-pay | Admitting: Cardiology

## 2023-04-14 DIAGNOSIS — I480 Paroxysmal atrial fibrillation: Secondary | ICD-10-CM

## 2023-04-14 NOTE — Telephone Encounter (Signed)
Pt last saw Dr Tomie China 08/21/22, last labs 03/24/23 Creat 0.83, age 88, weight 60.5kg, based on specified criteria pt is on appropriate dosage of Eliquis 5mg  BID for afib.  Will refill rx.

## 2023-05-21 ENCOUNTER — Encounter: Payer: Self-pay | Admitting: Cardiology

## 2023-05-21 ENCOUNTER — Ambulatory Visit: Payer: Medicare HMO | Attending: Cardiology | Admitting: Cardiology

## 2023-05-21 VITALS — BP 136/70 | HR 64 | Ht 63.0 in | Wt 132.4 lb

## 2023-05-21 DIAGNOSIS — E7800 Pure hypercholesterolemia, unspecified: Secondary | ICD-10-CM

## 2023-05-21 DIAGNOSIS — Z79899 Other long term (current) drug therapy: Secondary | ICD-10-CM | POA: Diagnosis not present

## 2023-05-21 DIAGNOSIS — I480 Paroxysmal atrial fibrillation: Secondary | ICD-10-CM

## 2023-05-21 DIAGNOSIS — I10 Essential (primary) hypertension: Secondary | ICD-10-CM | POA: Diagnosis not present

## 2023-05-21 NOTE — Patient Instructions (Signed)
Medication Instructions:  Your physician has recommended you make the following change in your medication:   Stop Metoprolol  *If you need a refill on your cardiac medications before your next appointment, please call your pharmacy*   Lab Work: None ordered If you have labs (blood work) drawn today and your tests are completely normal, you will receive your results only by: MyChart Message (if you have MyChart) OR A paper copy in the mail If you have any lab test that is abnormal or we need to change your treatment, we will call you to review the results.   Testing/Procedures: None ordered   Follow-Up: At [facility], you and your health needs are our priority.  As part of our continuing mission to provide you with exceptional heart care, we have created designated Provider Care Teams.  These Care Teams include your primary Cardiologist (physician) and Advanced Practice Providers (APPs -  Physician Assistants and Nurse Practitioners) who all work together to provide you with the care you need, when you need it.  We recommend signing up for the patient portal called "MyChart".  Sign up information is provided on this After Visit Summary.  MyChart is used to connect with patients for Virtual Visits (Telemedicine).  Patients are able to view lab/test results, encounter notes, upcoming appointments, etc.  Non-urgent messages can be sent to your provider as well.   To learn more about what you can do with MyChart, go to [email or web address].    Your next appointment:   9 month(s)  The format for your next appointment:   In Person  Provider:   Belva Crome, MD    Other Instructions none  Important Information About Sugar

## 2023-05-21 NOTE — Progress Notes (Signed)
Cardiology Office Note:    Date:  05/21/2023   ID:  Christine Baker, DOB 1935/03/18, MRN [id number]  PCP:  Gordan Payment., MD  Cardiologist:  Garwin Brothers, MD   Referring MD: Gordan Payment., MD    ASSESSMENT:    1. Paroxysmal atrial fibrillation (HCC)   2. Essential (primary) hypertension   3. On amiodarone therapy   4. Pure hypercholesterolemia    PLAN:    In order of problems listed above:  Primary prevention stressed with the patient.  Importance of compliance with diet medication stressed and patient verbalized standing.  She was advised to walk to the best of her ability. Essential hypertension: Blood pressure stable and diet was emphasized.  She gives history of some low heart rate at times.  So have discontinued metoprolol.  She will keep a track of pulse blood pressure and get back to me. Paroxysmal atrial fibrillation:I discussed with the patient atrial fibrillation, disease process. Management and therapy including rate and rhythm control, anticoagulation benefits and potential risks were discussed extensively with the patient. Patient had multiple questions which were answered to patient's satisfaction. Amiodarone therapy: On low-dose amiodarone and recent liver functions were fine. Mixed dyslipidemia: This is managed by primary care.  Diet emphasized.  Lipids reviewed. Patient will be seen in follow-up appointment in 6 months or earlier if the patient has any concerns.    Medication Adjustments/Labs and Tests Ordered: Current medicines are reviewed at length with the patient today.  Concerns regarding medicines are outlined above.  Orders Placed This Encounter  Procedures   EKG 12-Lead   No orders of the defined types were placed in this encounter.    No chief complaint on file.    History of Present Illness:    Christine Baker is a 88 y.o. female.  Patient has past medical history of essential hypertension, mixed dyslipidemia, paroxysmal atrial fibrillation  and amiodarone therapy.  She denies any problems at this time and takes care of activities of daily living.  No chest pain orthopnea or PND.  She ambulates on a regular basis.  At the time of my evaluation, the patient is alert awake oriented and in no distress.  Past Medical History:  Diagnosis Date   A-fib [facility])    Acquired hypothyroidism 12/03/2015   Last Assessment & Plan:  Formatting of this note might be different from the original. Relevant Hx: Course: Daily Update: Today's Plan:this needs to be checked  Electronically signed by: Krystal Clark, NP 03/19/16 [phone]   Age-related osteoporosis without current pathological fracture 12/03/2015   Formatting of this note might be different from the original. REcommend meds based on Dexa 03/2020.  Last Assessment & Plan:  Formatting of this note might be different from the original. Relevant Hx: Course: Daily Update: Today's Plan:increase her vitamin d  Electronically signed by: Krystal Clark, NP 03/19/16 [phone]   Anxiety 10/21/2017   Formatting of this note might be different from the original. mild   Arrhythmia    Arthritis of hand 03/17/2022   Carpal tunnel syndrome of left wrist 03/17/2022   Dyspnea on exertion 03/19/2016   Last Assessment & Plan:  Formatting of this note might be different from the original. Relevant Hx: Course: Daily Update: Today's Plan:100% pulse ox reading on RA resting, EKG done today and she is going to be referred back to cardiology to ensure that all stable for her with the dyspnea and her known arrythymia  Electronically signed by: Efraim Kaufmann  Richardson Dopp, NP 03/19/16 [phone]   Dysuria 09/09/2018   Encounter for current long-term use of anticoagulants 12/03/2015   Last Assessment & Plan:  Relevant Hx: Course: Daily Update: Today's Plan:return for her protime  Electronically signed by: Krystal Clark, NP 12/03/15 [phone]  Last Assessment & Plan:  Formatting of this note might be different  from the original. Relevant Hx: Course: Daily Update: Today's Plan:return for her protime  Electronically signed by: Krystal Clark, NP 12/03/15 [phone]   Essential (primary) hypertension 12/03/2015   Last Assessment & Plan:  Relevant Hx: Course: Daily Update: Today's Plan:this is stable for her overall  Electronically signed by: Krystal Clark, NP 03/19/16 [phone]  Last Assessment & Plan:  Formatting of this note might be different from the original. Relevant Hx: Course: Daily Update: Today's Plan:this is stable for her overall  Electronically signed by: Krystal Clark, NP   Gastro-esophageal reflux disease with esophagitis 12/03/2015   Last Assessment & Plan:  Relevant Hx: Course: Daily Update: Today's Plan:this is stable for her  Electronically signed by: Krystal Clark, NP 03/19/16 [phone]  Last Assessment & Plan:  Formatting of this note might be different from the original. Relevant Hx: Course: Daily Update: Today's Plan:this is stable for her  Electronically signed by: Krystal Clark, NP 03/19/16 0928   Hair loss 10/02/2016   High risk medication use 10/21/2017   Local edema 10/02/2016   Malaise and fatigue 05/23/2020   Mild episode of recurrent major depressive disorder (HCC) 03/24/2023   Need for influenza vaccination 09/09/2018   Need for pneumococcal vaccination 09/09/2018   On amiodarone therapy 01/24/2020   Osteopenia of multiple sites 12/03/2015   Last Assessment & Plan:  Relevant Hx: Course: Daily Update: Today's Plan:increase her vitamin d  Electronically signed by: Krystal Clark, NP 03/19/16 [phone]  Last Assessment & Plan:  Formatting of this note might be different from the original. Relevant Hx: Course: Daily Update: Today's Plan:increase her vitamin d  Electronically signed by: Krystal Clark, NP 03/19/16 [phone]   Paroxysmal atrial fibrillation (HCC) 12/03/2015   Last Assessment & Plan:  Formatting of this  note might be different from the original. Relevant Hx: Course: Daily Update: Today's Plan:slight increase in SOB and concerning for her and she is going to be referred back to [facility]  Electronically signed by: Krystal Clark, NP 03/19/16 0930   Perennial allergic rhinitis 12/03/2015   Last Assessment & Plan:  Relevant Hx: Course: Daily Update: Today's Plan:discussed this in depth and can randomly take this   Electronically signed by: Krystal Clark, NP 03/19/16 [phone]  Last Assessment & Plan:  Formatting of this note might be different from the original. Relevant Hx: Course: Daily Update: Today's Plan:discussed this in depth and can randomly take this   Electronically   Prediabetes 09/15/2022   Primary insomnia 10/21/2017   Primary osteoarthritis involving multiple joints 12/03/2015   Last Assessment & Plan:  Relevant Hx: Course: Daily Update: Today's Plan:she feels that this is stable for her overall  Electronically signed by: Krystal Clark, NP 03/19/16 [phone]  Last Assessment & Plan:  Formatting of this note might be different from the original. Relevant Hx: Course: Daily Update: Today's Plan:she feels that this is stable for her overall  Electronically signed by:    Pure hypercholesterolemia 12/03/2015   Last Assessment & Plan:  Relevant Hx: Course: Daily Update: Today's Plan:this is to be checked fasting  Electronically signed by: Krystal Clark, NP  03/19/16 0924  Last Assessment & Plan:  Formatting of this note might be different from the original. Relevant Hx: Course: Daily Update: Today's Plan:this is to be checked fasting  Electronically signed by: Krystal Clark, NP 0   Rash of face 12/15/2017   Stress reaction, acute, predominant emotional 05/02/2020   Formatting of this note might be different from the original. Son with recent CVA   Swelling of face 12/15/2017   Thrombocytopenia (HCC) 10/22/2018   Trigger thumb of left hand 03/17/2022    Urge incontinence 03/19/2016   Last Assessment & Plan:  Relevant Hx: Course: Daily Update: Today's Plan:detrol will be tried for this  Electronically signed by: Krystal Clark, NP 03/19/16 0931  Last Assessment & Plan:  Formatting of this note might be different from the original. Relevant Hx: Course: Daily Update: Today's Plan:detrol will be tried for this  Electronically signed by: Krystal Clark, NP 0   UTI symptoms 09/09/2018    Past Surgical History:  Procedure Laterality Date   ABDOMINAL HYSTERECTOMY     VARICOSE VEIN SURGERY      Current Medications: Current Meds  Medication Sig   amiodarone (PACERONE) 100 MG tablet Take 0.5 tablets (50 mg total) by mouth daily.   apixaban (ELIQUIS) 5 MG TABS tablet Take 1 tablet by mouth twice daily   Cholecalciferol (VITAMIN D3) 1000 units CAPS Take 3,000 Units by mouth daily.   fluticasone (FLONASE) 50 MCG/ACT nasal spray Place 1 spray into both nostrils as needed for allergies or rhinitis.   levothyroxine (SYNTHROID) 112 MCG tablet Take 56 mcg by mouth daily.   losartan (COZAAR) 50 MG tablet Take 25 mg by mouth daily.   metoprolol tartrate (LOPRESSOR) 25 MG tablet Take 1/2 (one-half) tablet by mouth twice daily   Multiple Vitamins-Minerals (ICAPS AREDS 2 PO) Take 2 capsules by mouth daily.   omeprazole (PRILOSEC) 20 MG capsule Take 20 mg by mouth daily.   RESTASIS 0.05 % ophthalmic emulsion Place 1 drop into both eyes as needed (dry eyes).   sertraline (ZOLOFT) 50 MG tablet Take 50 mg by mouth daily.     Allergies:   Bacitracin, Cefdinir, Orphenadrine-aspirin-caffeine, Tetracyclines & related, Erythromycin base, and Tetracycline   Social History   Socioeconomic History   Marital status: Married    Spouse name: Not on file   Number of children: Not on file   Years of education: Not on file   Highest education level: Not on file  Occupational History   Not on file  Tobacco Use   Smoking status: Former     Current packs/day: 0.00    Average packs/day: 0.5 packs/day for 15.0 years (7.5 ttl pk-yrs)    Types: Cigarettes    Start date: 79    Quit date: 49    Years since quitting: 54.6   Smokeless tobacco: Never  Vaping Use   Vaping status: Never Used  Substance and Sexual Activity   Alcohol use: No   Drug use: No   Sexual activity: Not on file  Other Topics Concern   Not on file  Social History Narrative   Not on file   Social Determinants of Health   Financial Resource Strain: Not on file  Food Insecurity: Not on file  Transportation Needs: Not on file  Physical Activity: Not on file  Stress: Not on file  Social Connections: Not on file     Family History: The patient's family history includes Diabetes in her mother, sister, and son; Heart  attack in her father and mother; Stroke in her father and mother.  ROS:   Please see the history of present illness.    All other systems reviewed and are negative.  EKGs/Labs/Other Studies Reviewed:    The following studies were reviewed today: .Marland KitchenEKG Interpretation Date/Time:  Thursday May 21 2023 15:27:29 EDT Ventricular Rate:  64 PR Interval:  166 QRS Duration:  126 QT Interval:  424 QTC Calculation: 437 R Axis:   90  Text Interpretation: Normal sinus rhythm Right bundle branch block Abnormal ECG No previous ECGs available Confirmed by Belva Crome [phone]) on 05/21/2023 3:52:56 PM     Recent Labs: 08/21/2022: ALT 16; BUN 17; Creatinine, Ser 0.84; Hemoglobin 12.8; Platelets 155; Potassium 4.7; Sodium 139; TSH 5.270  Recent Lipid Panel    Component Value Date/Time   CHOL 224 (H) 01/24/2020 1010   TRIG 119 01/24/2020 1010   HDL 66 01/24/2020 1010   CHOLHDL 3.4 01/24/2020 1010   LDLCALC 137 (H) 01/24/2020 1010    Physical Exam:    VS:  BP 136/70   Pulse 64   Ht 5\' 3"  (1.6 m)   Wt 132 lb 6.4 oz (60.1 kg)   SpO2 95%   BMI 23.45 kg/m     Wt Readings from Last 3 Encounters:  05/21/23 132 lb 6.4 oz (60.1 kg)   08/21/22 133 lb 6.4 oz (60.5 kg)  10/22/21 135 lb 3.2 oz (61.3 kg)     GEN: Patient is in no acute distress HEENT: Normal NECK: No JVD; No carotid bruits LYMPHATICS: No lymphadenopathy CARDIAC: Hear sounds regular, 2/6 systolic murmur at the apex. RESPIRATORY:  Clear to auscultation without rales, wheezing or rhonchi  ABDOMEN: Soft, non-tender, non-distended MUSCULOSKELETAL:  No edema; No deformity  SKIN: Warm and dry NEUROLOGIC:  Alert and oriented x 3 PSYCHIATRIC:  Normal affect   Signed, Garwin Brothers, MD  05/21/2023 3:54 PM    [facility]

## 2023-06-03 ENCOUNTER — Other Ambulatory Visit: Payer: Self-pay | Admitting: Cardiology

## 2023-07-06 DIAGNOSIS — M79642 Pain in left hand: Secondary | ICD-10-CM

## 2023-07-06 HISTORY — DX: Pain in left hand: M79.642

## 2023-07-12 ENCOUNTER — Other Ambulatory Visit: Payer: Self-pay | Admitting: Cardiology

## 2023-07-20 ENCOUNTER — Telehealth: Payer: Self-pay | Admitting: Cardiology

## 2023-07-20 NOTE — Telephone Encounter (Signed)
Pt c/o BP issue: STAT if pt c/o blurred vision, one-sided weakness or slurred speech  1. What are your last 5 BP readings?  08:30 PM 135/115 HR 140 09:30 PM 132/101 HR 24 - Then took 1/2 Tablet of Metoprolol 10:30 PM 142/79 HR 85 This morning 133/67 HR 63  2. Are you having any other symptoms (ex. Dizziness, headache, blurred vision, passed out)?  No  3. What is your BP issue? Patient stated on Friday and Saturday she started feeling bad. Patient stated she took her BP on Sunday night and this morning. Patient would like to know if she should start taking her Metoprolol again. Please advise.

## 2023-07-20 NOTE — Telephone Encounter (Signed)
Called patient and she reported that this past Friday she felt like she was going into A-fib because she became very tired and felt "bad".  She took her blood pressure which was 135/115 HR 140. She checked it again at 9:30 pm and it was 132/101. She had some Metoprolol left over from when Dr. Tomie China has stopped this medication for her and she took 1/2 of a metoprolol. She checked her blood pressure at 10:30 pm and it was 142/79 with a HR of 85. This morning she checked her blood pressure and it was 133/67 HR 63. Dr. Tomie China had stopped her Metoprolol because she was dizzy during her last office visit. Since her office visit she has had no dizziness and she is asking if she can restart her Metoprolol. Please advise.

## 2023-07-21 NOTE — Telephone Encounter (Signed)
Patient called back to follow-up on advise regarding her A-fib and high BP symptoms.

## 2023-07-22 ENCOUNTER — Other Ambulatory Visit: Payer: Self-pay

## 2023-07-22 MED ORDER — METOPROLOL TARTRATE 25 MG PO TABS: 12.5000 mg | ORAL_TABLET | Freq: Two times a day (BID) | ORAL | 3 refills | Status: DC

## 2023-07-22 NOTE — Telephone Encounter (Signed)
Spoke with Dr. Bing Matter and he recommended that she start taking her metoprolol tartrate 12.5 mg BID again. Called the patient and informed her of Dr. Vanetta Shawl recommendation and she verbalized understanding and had no further questions at this time.

## 2023-10-12 ENCOUNTER — Telehealth: Payer: Self-pay | Admitting: Cardiology

## 2023-10-12 NOTE — Telephone Encounter (Signed)
   Pre-operative Risk Assessment    Patient Name: Christine Baker  DOB: 1935/05/19 MRN: [id number]   Date of last office visit: 05/21/2023 Date of next office visit: 10/15/2023   Request for Surgical Clearance    Procedure:   LEFT CARPAL TUNNEL RELEASE & LEFT TRIGGER THUMB RELEASE  Date of Surgery:  Clearance TBD                                Surgeon:  Dr. Elsie Mussel Surgeon's Group or Practice Name:  [facility] Phone number:  [phone] Fax number:  [phone]   Type of Clearance Requested:   - Medical  - Pharmacy:  Hold Apixaban  (Eliquis ) Please give Eliquis  instructions   Type of Anesthesia:  Not Indicated   Additional requests/questions:  Please advise surgeon/provider what medications should be held.  Signed, Dickie GORMAN Side   10/12/2023, 2:48 PM

## 2023-10-12 NOTE — Telephone Encounter (Signed)
 Primary Cardiologist:Rajan R Revankar, MD  Chart reviewed as part of pre-operative protocol coverage. Because of Christine Baker's past medical history and time since last visit, he/she will require a follow-up visit in order to better assess preoperative cardiovascular risk.  Pre-op covering staff: - Patient has an appointment on 10/16/23 with Delon Hoover, NP at which time clearance will be addressed. Appointment notes have been updated.  - Please contact requesting surgeon's office via preferred method (i.e, phone, fax) to inform them of need for appointment prior to surgery.  If applicable, this message will also be routed to pharmacy pool and/or primary cardiologist for input on holding anticoagulant/antiplatelet agent as requested below so that this information is available at time of patient's appointment.   Rosaline EMERSON Bane, NP-C  10/12/2023, 4:03 PM [address], [address] Office [phone] Fax [phone]

## 2023-10-12 NOTE — Telephone Encounter (Signed)
 Appointment notes updated to reflect need for preop clearance

## 2023-10-13 NOTE — Telephone Encounter (Signed)
 Patient with diagnosis of afib on Eliquis  for anticoagulation.    Procedure: LEFT CARPAL TUNNEL RELEASE & LEFT TRIGGER THUMB RELEASE  Date of procedure: TBD   CHA2DS2-VASc Score = 4   This indicates a 4.8% annual risk of stroke. The patient's score is based upon: CHF History: 0 HTN History: 1 Diabetes History: 0 Stroke History: 0 Vascular Disease History: 0 Age Score: 2 Gender Score: 1      CrCl 46 ml/min Platelet count 138  Per office protocol, patient can hold Eliquis  for 2 days prior to procedure.     **This guidance is not considered finalized until pre-operative APP has relayed final recommendations.**

## 2023-10-15 ENCOUNTER — Encounter: Payer: Self-pay | Admitting: Cardiology

## 2023-10-15 NOTE — Progress Notes (Deleted)
  Cardiology Office Note:  .   Date:  10/15/2023  ID:  Christine Baker, DOB 12-Jan-1935, MRN [id number] PCP: Thurmond Cathlyn LABOR., MD  [facility] Providers Cardiologist:  Pamula Luther JONELLE Crape, MD { Click to update primary MD,subspecialty MD or APP then REFRESH:1}   History of Present Illness: .   Christine Baker is a 89 y.o. female with a past medical history of PAF, hypertension, GERD, hypothyroidism, dyslipidemia.  08/26/2022 echo EF 50 to 55%, grade 2 DD, possible bicuspid aortic valve, moderate MR  Most recently evaluated by Dr. Crape on 05/21/2023, she was stable from a cardiac perspective and maintaining sinus rhythm, advised to follow-up in 6 months.  Left carpal tunnel, trigger thumb release, Dr. Ezzie, date TBD, can hold Eliquis  for 2 days ROS: ***  Studies Reviewed: .        *** Risk Assessment/Calculations:   {Does this patient have ATRIAL FIBRILLATION?:[phone]} No BP recorded.  {Refresh Note OR Click here to enter BP  :1}***       Physical Exam:   VS:  There were no vitals taken for this visit.   Wt Readings from Last 3 Encounters:  05/21/23 132 lb 6.4 oz (60.1 kg)  08/21/22 133 lb 6.4 oz (60.5 kg)  10/22/21 135 lb 3.2 oz (61.3 kg)    GEN: Well nourished, well developed in no acute distress NECK: No JVD; No carotid bruits CARDIAC: ***RRR, no murmurs, rubs, gallops RESPIRATORY:  Clear to auscultation without rales, wheezing or rhonchi  ABDOMEN: Soft, non-tender, non-distended EXTREMITIES:  No edema; No deformity   ASSESSMENT AND PLAN: .   ***    {Are you ordering a CV Procedure (e.g. stress test, cath, DCCV, TEE, etc)?   Press F2        :[id number]}  Dispo: ***  Signed, Delon JAYSON Hoover, NP

## 2023-10-16 ENCOUNTER — Encounter: Payer: Self-pay | Admitting: Cardiology

## 2023-10-16 ENCOUNTER — Ambulatory Visit: Payer: Medicare HMO | Attending: Cardiology | Admitting: Cardiology

## 2023-10-16 ENCOUNTER — Ambulatory Visit: Payer: Medicare HMO | Admitting: Cardiology

## 2023-10-16 VITALS — BP 132/68 | HR 70 | Ht 63.0 in | Wt 136.4 lb

## 2023-10-16 DIAGNOSIS — D6859 Other primary thrombophilia: Secondary | ICD-10-CM

## 2023-10-16 DIAGNOSIS — I480 Paroxysmal atrial fibrillation: Secondary | ICD-10-CM | POA: Diagnosis not present

## 2023-10-16 DIAGNOSIS — Z01810 Encounter for preprocedural cardiovascular examination: Secondary | ICD-10-CM | POA: Diagnosis not present

## 2023-10-16 DIAGNOSIS — Z79899 Other long term (current) drug therapy: Secondary | ICD-10-CM | POA: Diagnosis not present

## 2023-10-16 NOTE — Patient Instructions (Signed)
 Medication Instructions:  Your physician recommends that you continue on your current medications as directed. Please refer to the Current Medication list given to you today.  *If you need a refill on your cardiac medications before your next appointment, please call your pharmacy*   Lab Work: NONE If you have labs (blood work) drawn today and your tests are completely normal, you will receive your results only by: MyChart Message (if you have MyChart) OR A paper copy in the mail If you have any lab test that is abnormal or we need to change your treatment, we will call you to review the results.   Testing/Procedures: NONE   Follow-Up: At [facility], you and your health needs are our priority.  As part of our continuing mission to provide you with exceptional heart care, we have created designated Provider Care Teams.  These Care Teams include your primary Cardiologist (physician) and Advanced Practice Providers (APPs -  Physician Assistants and Nurse Practitioners) who all work together to provide you with the care you need, when you need it.  We recommend signing up for the patient portal called "MyChart".  Sign up information is provided on this After Visit Summary.  MyChart is used to connect with patients for Virtual Visits (Telemedicine).  Patients are able to view lab/test results, encounter notes, upcoming appointments, etc.  Non-urgent messages can be sent to your provider as well.   To learn more about what you can do with MyChart, go to [email or web address].    Your next appointment:   6 month(s)  Provider:   Belva Crome, MD    Other Instructions

## 2023-10-16 NOTE — Progress Notes (Signed)
 Cardiology Office Note:  .   Date:  10/15/2023  ID:  Christine Baker, DOB 1935/09/15, MRN [id number] PCP: Thurmond Cathlyn LABOR., MD  [facility] Providers Cardiologist:  Zaryia Markel JONELLE Crape, MD   History of Present Illness: .   Christine Baker is a 89 y.o. female with a past medical history of PAF on amiodarone  and Eliquis , hypertension, GERD, hypothyroidism, dyslipidemia.  08/26/2022 echo EF 50 to 55%, grade 2 DD, possible bicuspid aortic valve, moderate MR  Most recently evaluated by Dr. Crape on 05/21/2023, she was stable from a cardiac perspective and maintaining sinus rhythm, advised to follow-up in 6 months.  She presents today for preoperative evaluation.  She has been doing well since she was last evaluated in our office, offers no formal complaints.  She tolerating her Eliquis  without side effects, denies hematuria, hemoptysis, hematochezia.  She stays active in her home, able to complete all her activities of daily living. She denies chest pain, palpitations, dyspnea, pnd, orthopnea, n, v, dizziness, syncope, edema, weight gain, or early satiety.   ROS: Review of Systems  Eyes:        Recently diagnosed with macular degeneration  All other systems reviewed and are negative.    Studies Reviewed: .        Cardiac Studies & Procedures      ECHOCARDIOGRAM  ECHOCARDIOGRAM COMPLETE 08/26/2022  Narrative ECHOCARDIOGRAM REPORT    Patient Name:   Christine Baker [facility] Date of Exam: 08/26/2022 Medical Rec #:  [id number]    Height:       63.6 in Accession #:    [id number]   Weight:       133.4 lb Date of Birth:  [birth year]   BSA:          1.639 m Patient Age:    86 years     BP:           124/64 mmHg Patient Gender: F            HR:           63 bpm. Exam Location:  [place]  Procedure: 2D Echo, Cardiac Doppler, Color Doppler and Strain Analysis  Indications:    Paroxysmal atrial fibrillation (HCC) [I48.0 (ICD-10-CM)]  History:        Patient has no prior history of Echocardiogram  examinations. Arrythmias:Atrial Fibrillation; Risk Factors:Hypertension and Dyslipidemia.  Sonographer:    Charlie Jointer RDCS Referring Phys: CYRUS Zyere Jiminez JONELLE [facility]  IMPRESSIONS   1. Left ventricular ejection fraction, by estimation, is 50 to 55%. The left ventricle has low normal function. The left ventricle has no regional wall motion abnormalities. Left ventricular diastolic parameters are consistent with Grade II diastolic dysfunction (pseudonormalization). 2. The mitral valve is normal in structure. Moderate mitral valve regurgitation. No evidence of mitral stenosis. 3. Possibly bicuspid. The aortic valve was not well visualized. Aortic valve regurgitation is not visualized. No aortic stenosis is present.  FINDINGS Left Ventricle: Left ventricular ejection fraction, by estimation, is 50 to 55%. The left ventricle has low normal function. The left ventricle has no regional wall motion abnormalities. The left ventricular internal cavity size was normal in size. There is no left ventricular hypertrophy. Left ventricular diastolic parameters are consistent with Grade II diastolic dysfunction (pseudonormalization).  Right Ventricle: The right ventricular size is normal. No increase in right ventricular wall thickness. Right ventricular systolic function is normal. There is normal pulmonary artery systolic pressure. The tricuspid regurgitant velocity is 2.36 m/s, and with an  assumed right atrial pressure of 3 mmHg, the estimated right ventricular systolic pressure is 25.3 mmHg.  Left Atrium: Left atrial size was normal in size.  Right Atrium: Right atrial size was normal in size.  Pericardium: There is no evidence of pericardial effusion.  Mitral Valve: The mitral valve is normal in structure. Moderate mitral valve regurgitation. No evidence of mitral valve stenosis.  Tricuspid Valve: The tricuspid valve is normal in structure. Tricuspid valve regurgitation is mild . No evidence of  tricuspid stenosis.  Aortic Valve: Possibly bicuspid. The aortic valve was not well visualized. Aortic valve regurgitation is not visualized. No aortic stenosis is present.  Pulmonic Valve: The pulmonic valve was normal in structure. Pulmonic valve regurgitation is not visualized. No evidence of pulmonic stenosis.  Aorta: The aortic root is normal in size and structure.  Venous: The inferior vena cava is normal in size with greater than 50% respiratory variability, suggesting right atrial pressure of 3 mmHg.  IAS/Shunts: No atrial level shunt detected by color flow Doppler.   LEFT VENTRICLE PLAX 2D LVIDd:         3.80 cm   Diastology LVIDs:         2.50 cm   LV e' medial:    9.46 cm/s LV PW:         0.90 cm   LV E/e' medial:  13.6 LV IVS:        1.00 cm   LV e' lateral:   9.57 cm/s LVOT diam:     1.70 cm   LV E/e' lateral: 13.5 LV SV:         42 LV SV Index:   26 LVOT Area:     2.27 cm   RIGHT VENTRICLE            IVC RV Basal diam:  2.90 cm    IVC diam: 1.80 cm RV S prime:     9.79 cm/s TAPSE (M-mode): 2.1 cm  LEFT ATRIUM           Index        RIGHT ATRIUM           Index LA diam:      3.30 cm 2.01 cm/m   RA Area:     10.30 cm LA Vol (A2C): 32.2 ml 19.65 ml/m  RA Volume:   20.60 ml  12.57 ml/m LA Vol (A4C): 28.5 ml 17.39 ml/m AORTIC VALVE LVOT Vmax:   73.70 cm/s LVOT Vmean:  55.500 cm/s LVOT VTI:    0.187 m  AORTA Ao Root diam: 2.80 cm Ao Asc diam:  2.90 cm Ao Desc diam: 1.70 cm  MITRAL VALVE                TRICUSPID VALVE MV Area (PHT): 5.13 cm     TR Peak grad:   22.3 mmHg MV Decel Time: 148 msec     TR Vmax:        236.00 cm/s MR Peak grad: 85.7 mmHg MR Vmax:      463.00 cm/s   SHUNTS MV E velocity: 129.00 cm/s  Systemic VTI:  0.19 m MV A velocity: 80.90 cm/s   Systemic Diam: 1.70 cm MV E/A ratio:  1.59  Christine Buege Crape MD Electronically signed by Safaa Stingley Crape MD Signature Date/Time: 08/26/2022/5:21:25 PM    Final             Risk  Assessment/Calculations:    CHA2DS2-VASc Score = 4   This indicates a 4.8%  annual risk of stroke. The patient's score is based upon: CHF History: 0 HTN History: 1 Diabetes History: 0 Stroke History: 0 Vascular Disease History: 0 Age Score: 2 Gender Score: 1     Physical Exam:   VS:  There were no vitals taken for this visit.   Wt Readings from Last 3 Encounters:  05/21/23 132 lb 6.4 oz (60.1 kg)  08/21/22 133 lb 6.4 oz (60.5 kg)  10/22/21 135 lb 3.2 oz (61.3 kg)    GEN: Well nourished, well developed in no acute distress NECK: No JVD; No carotid bruits CARDIAC: RRR, no murmurs, rubs, gallops RESPIRATORY:  Clear to auscultation without rales, wheezing or rhonchi  ABDOMEN: Soft, non-tender, non-distended EXTREMITIES:  No edema; No deformity   ASSESSMENT AND PLAN: .   PAF/hypercoagulable state/on amiodarone  therapy -CHA2DS2-VASc score of 4, she is maintaining sinus rhythm, continue Eliquis  5 mg twice daily--no indication for dose reduction, labs were recently collected by her PCP yesterday we will request those labs to be sent to our office.  Continue amiodarone  50 mg daily--requesting labs from PCP.  Continue metoprolol  12.5 mg twice daily.  Hypertension-blood pressure is controlled 132/68, continue metoprolol  12.5 mg twice daily, was previously on losartan  however this appears it has been stopped since she was last evaluated in our office.  Preoperative cardiovascular examination - According to the Revised Cardiac Risk Index (RCRI), her Perioperative Risk of Major Cardiac Event is (%): 0.4 Her Functional Capacity in METs is: 5.07 according to the Duke Activity Status Index (DASI). Therefore, based on ACC/AHA guidelines, patient would be at acceptable risk for the planned procedure without further cardiovascular testing. I will route this recommendation to the requesting party via Epic fax function.  Regarding her Eliquis , per office protocol she can hold this 2 days prior to her  procedure and should be resumed as soon as possible after completion of surgery.          Dispo: Request labs from PCP, follow up in 6 months with Dr. Edwyna.   Signed, Delon JAYSON Hoover, NP

## 2023-12-31 ENCOUNTER — Telehealth: Payer: Self-pay | Admitting: Emergency Medicine

## 2023-12-31 MED ORDER — APIXABAN 5 MG PO TABS: 5.0000 mg | ORAL_TABLET | Freq: Two times a day (BID) | ORAL | Status: DC

## 2023-12-31 NOTE — Telephone Encounter (Signed)
 Patient in office requesting samples of eliquis 5 mg. Dose verified by Wallis Bamberg, NP. Samples given to patient.

## 2024-03-23 ENCOUNTER — Telehealth: Payer: Self-pay

## 2024-03-23 MED ORDER — APIXABAN 5 MG PO TABS: 5.0000 mg | ORAL_TABLET | Freq: Two times a day (BID) | ORAL | Status: DC

## 2024-03-23 NOTE — Telephone Encounter (Signed)
 Samples for the patient given to [employer] per patient request.

## 2024-04-21 ENCOUNTER — Ambulatory Visit: Admitting: Cardiology

## 2024-04-28 ENCOUNTER — Other Ambulatory Visit: Payer: Self-pay | Admitting: Cardiology

## 2024-04-28 DIAGNOSIS — I480 Paroxysmal atrial fibrillation: Secondary | ICD-10-CM

## 2024-04-28 NOTE — Telephone Encounter (Signed)
 Prescription refill request for Eliquis  received. Indication: afib  Last office visit: [place] 10/16/2023 Scr:  0.74, 04/04/2024  Age: 89 yo  Weight: 61.9 kg    Refill sent.

## 2024-05-03 ENCOUNTER — Ambulatory Visit: Attending: Cardiology | Admitting: Cardiology

## 2024-05-03 ENCOUNTER — Encounter: Payer: Self-pay | Admitting: Cardiology

## 2024-05-03 VITALS — BP 140/72 | HR 56 | Ht 63.6 in | Wt 132.8 lb

## 2024-05-03 DIAGNOSIS — I10 Essential (primary) hypertension: Secondary | ICD-10-CM | POA: Diagnosis not present

## 2024-05-03 DIAGNOSIS — Z79899 Other long term (current) drug therapy: Secondary | ICD-10-CM | POA: Diagnosis not present

## 2024-05-03 DIAGNOSIS — E7800 Pure hypercholesterolemia, unspecified: Secondary | ICD-10-CM

## 2024-05-03 DIAGNOSIS — I480 Paroxysmal atrial fibrillation: Secondary | ICD-10-CM | POA: Diagnosis not present

## 2024-05-03 NOTE — Patient Instructions (Signed)

## 2024-05-03 NOTE — Progress Notes (Signed)
 Cardiology Office Note:    Date:  05/03/2024   ID:  Christine Baker, DOB 03-Apr-1935, MRN [id number]  PCP:  Thurmond Cathlyn LABOR., MD  Cardiologist:  Jennifer JONELLE Crape, MD   Referring MD: Thurmond Cathlyn LABOR., MD    ASSESSMENT:    1. Pure hypercholesterolemia   2. Paroxysmal atrial fibrillation (HCC)   3. Essential (primary) hypertension   4. On amiodarone  therapy    PLAN:    In order of problems listed above:  Primary prevention stressed with the patient.  Importance of compliance with diet medication stressed and patient verbalized standing. Paroxysmal atrial fibrillation:I discussed with the patient atrial fibrillation, disease process. Management and therapy including rate and rhythm control, anticoagulation benefits and potential risks were discussed extensively with the patient. Patient had multiple questions which were answered to patient's satisfaction. Essential hypertension: Blood pressure stable and diet was emphasized.  She mentioned with the blood pressure readings. Mixed dyslipidemia: Not on on lipid-lowering medications.  Diet was emphasized and lifestyle modification was urged she is vehemently against lipid-lowering therapy and I respect her wishes. Amiodarone  therapy: Explained benefits and risks and she understands. Patient will be seen in follow-up appointment in 6 months or earlier if the patient has any concerns.   Medication Adjustments/Labs and Tests Ordered: Current medicines are reviewed at length with the patient today.  Concerns regarding medicines are outlined above.  Orders Placed This Encounter  Procedures   EKG 12-Lead   No orders of the defined types were placed in this encounter.    No chief complaint on file.    History of Present Illness:    Christine Baker is a 89 y.o. female.  Patient has past medical history of paroxysmal atrial fibrillation, essential hypertension, mixed dyslipidemia and is on amiodarone  therapy.  She is tolerating it well.  Recent  blood work was unremarkable except elevated lipids.  At the time of my evaluation, the patient is alert awake oriented and in no distress.  She tells me that she walks on a regular basis.  Past Medical History:  Diagnosis Date   A-fib [facility])    Acquired hypothyroidism 12/03/2015   Last Assessment & Plan:  Formatting of this note might be different from the original. Relevant Hx: Course: Daily Update: Today's Plan:this needs to be checked  Electronically signed by: Eleanor Merlynn Lady, NP 03/19/16 [phone]   Age-related osteoporosis without current pathological fracture 12/03/2015   Formatting of this note might be different from the original. REcommend meds based on Dexa 03/2020.  Last Assessment & Plan:  Formatting of this note might be different from the original. Relevant Hx: Course: Daily Update: Today's Plan:increase her vitamin d  Electronically signed by: Eleanor Merlynn Lady, NP 03/19/16 [phone]   Anxiety 10/21/2017   Formatting of this note might be different from the original. mild   Arrhythmia    Arthritis of hand 03/17/2022   Carpal tunnel syndrome of left wrist 03/17/2022   Dyspnea on exertion 03/19/2016   Last Assessment & Plan:  Formatting of this note might be different from the original. Relevant Hx: Course: Daily Update: Today's Plan:100% pulse ox reading on RA resting, EKG done today and she is going to be referred back to cardiology to ensure that all stable for her with the dyspnea and her known arrythymia  Electronically signed by: Eleanor Merlynn Lady, NP 03/19/16 0938   Dysuria 09/09/2018   Encounter for current long-term use of anticoagulants 12/03/2015   Last Assessment & Plan:  Relevant  Hx: Course: Daily Update: Today's Plan:return for her protime  Electronically signed by: Eleanor Merlynn Lady, NP 12/03/15 [phone]  Last Assessment & Plan:  Formatting of this note might be different from the original. Relevant Hx: Course: Daily Update: Today's Plan:return for  her protime  Electronically signed by: Eleanor Merlynn Lady, NP 12/03/15 [phone]   Essential (primary) hypertension 12/03/2015   Last Assessment & Plan:  Relevant Hx: Course: Daily Update: Today's Plan:this is stable for her overall  Electronically signed by: Eleanor Merlynn Lady, NP 03/19/16 [phone]  Last Assessment & Plan:  Formatting of this note might be different from the original. Relevant Hx: Course: Daily Update: Today's Plan:this is stable for her overall  Electronically signed by: Eleanor Merlynn Lady, NP   Gastro-esophageal reflux disease with esophagitis 12/03/2015   Last Assessment & Plan:  Relevant Hx: Course: Daily Update: Today's Plan:this is stable for her  Electronically signed by: Eleanor Merlynn Lady, NP 03/19/16 [phone]  Last Assessment & Plan:  Formatting of this note might be different from the original. Relevant Hx: Course: Daily Update: Today's Plan:this is stable for her  Electronically signed by: Eleanor Merlynn Lady, NP 03/19/16 0928   Hair loss 10/02/2016   High risk medication use 10/21/2017   Local edema 10/02/2016   Malaise and fatigue 05/23/2020   Mild episode of recurrent major depressive disorder (HCC) 03/24/2023   Need for influenza vaccination 09/09/2018   Need for pneumococcal vaccination 09/09/2018   On amiodarone  therapy 01/24/2020   Osteopenia of multiple sites 12/03/2015   Last Assessment & Plan:  Relevant Hx: Course: Daily Update: Today's Plan:increase her vitamin d  Electronically signed by: Eleanor Merlynn Lady, NP 03/19/16 [phone]  Last Assessment & Plan:  Formatting of this note might be different from the original. Relevant Hx: Course: Daily Update: Today's Plan:increase her vitamin d  Electronically signed by: Eleanor Merlynn Lady, NP 03/19/16 [phone]   Pain of left hand 07/06/2023   Paroxysmal atrial fibrillation (HCC) 12/03/2015   Last Assessment & Plan:  Formatting of this note might be different from the original.  Relevant Hx: Course: Daily Update: Today's Plan:slight increase in SOB and concerning for her and she is going to be referred back to [facility]  Electronically signed by: Eleanor Merlynn Lady, NP 03/19/16 0930   Perennial allergic rhinitis 12/03/2015   Last Assessment & Plan:  Relevant Hx: Course: Daily Update: Today's Plan:discussed this in depth and can randomly take this   Electronically signed by: Eleanor Merlynn Lady, NP 03/19/16 [phone]  Last Assessment & Plan:  Formatting of this note might be different from the original. Relevant Hx: Course: Daily Update: Today's Plan:discussed this in depth and can randomly take this   Electronically   Prediabetes 09/15/2022   Primary insomnia 10/21/2017   Primary osteoarthritis involving multiple joints 12/03/2015   Last Assessment & Plan:  Relevant Hx: Course: Daily Update: Today's Plan:she feels that this is stable for her overall  Electronically signed by: Eleanor Merlynn Lady, NP 03/19/16 [phone]  Last Assessment & Plan:  Formatting of this note might be different from the original. Relevant Hx: Course: Daily Update: Today's Plan:she feels that this is stable for her overall  Electronically signed by:    Pure hypercholesterolemia 12/03/2015   Last Assessment & Plan:  Relevant Hx: Course: Daily Update: Today's Plan:this is to be checked fasting  Electronically signed by: Eleanor Merlynn Lady, NP 03/19/16 [phone]  Last Assessment & Plan:  Formatting of this note might be different from the original. Relevant Hx:  Course: Daily Update: Today's Plan:this is to be checked fasting  Electronically signed by: Eleanor Merlynn Lady, NP 0   Rash of face 12/15/2017   Stress reaction, acute, predominant emotional 05/02/2020   Formatting of this note might be different from the original. Son with recent CVA   Swelling of face 12/15/2017   Thrombocytopenia (HCC) 10/22/2018   Trigger thumb of left hand 03/17/2022   Urge incontinence 03/19/2016   Last  Assessment & Plan:  Relevant Hx: Course: Daily Update: Today's Plan:detrol will be tried for this  Electronically signed by: Eleanor Merlynn Lady, NP 03/19/16 0931  Last Assessment & Plan:  Formatting of this note might be different from the original. Relevant Hx: Course: Daily Update: Today's Plan:detrol will be tried for this  Electronically signed by: Eleanor Merlynn Lady, NP 0   UTI symptoms 09/09/2018    Past Surgical History:  Procedure Laterality Date   ABDOMINAL HYSTERECTOMY     VARICOSE VEIN SURGERY      Current Medications: Current Meds  Medication Sig   amiodarone  (PACERONE ) 100 MG tablet Take 0.5 tablets (50 mg total) by mouth daily.   apixaban  (ELIQUIS ) 5 MG TABS tablet Take 1 tablet by mouth twice daily   BIOTIN 5000 PO Take 5,000 Units by mouth daily.   Cholecalciferol (VITAMIN D3) 1000 units CAPS Take 3,000 Units by mouth daily.   levothyroxine (SYNTHROID) 112 MCG tablet Take 56 mcg by mouth daily.   metoprolol  tartrate (LOPRESSOR ) 25 MG tablet Take 0.5 tablets (12.5 mg total) by mouth 2 (two) times daily.   Multiple Vitamins-Minerals (ICAPS AREDS 2 PO) Take 2 capsules by mouth daily.   omeprazole (PRILOSEC) 20 MG capsule Take 20 mg by mouth daily.     Allergies:   Bacitracin, Cefdinir, Orphenadrine-aspirin-caffeine, Tetracyclines & related, Erythromycin base, and Tetracycline   Social History   Socioeconomic History   Marital status: Married    Spouse name: Not on file   Number of children: Not on file   Years of education: Not on file   Highest education level: Not on file  Occupational History   Not on file  Tobacco Use   Smoking status: Former    Current packs/day: 0.00    Average packs/day: 0.5 packs/day for 15.0 years (7.5 ttl pk-yrs)    Types: Cigarettes    Start date: 25    Quit date: 75    Years since quitting: 55.6   Smokeless tobacco: Never  Vaping Use   Vaping status: Never Used  Substance and Sexual Activity   Alcohol use: No    Drug use: No   Sexual activity: Not on file  Other Topics Concern   Not on file  Social History Narrative   Not on file   Social Drivers of Health   Financial Resource Strain: Not on file  Food Insecurity: Low Risk  (04/04/2024)   Received from [facility]   Hunger Vital Sign    Within the past 12 months, you worried that your food would run out before you got money to buy more: Never true    Within the past 12 months, the food you bought just didn't last and you didn't have money to get more. : Never true  Transportation Needs: No Transportation Needs (04/04/2024)   Received from [facility]    In the past 12 months, has lack of reliable transportation kept you from medical appointments, meetings, work or from getting things needed for daily living? : No  Physical Activity: Not on file  Stress: Not on file  Social Connections: Not on file     Family History: The patient's family history includes Diabetes in her mother, sister, and son; Heart attack in her father and mother; Stroke in her father and mother.  ROS:   Please see the history of present illness.    All other systems reviewed and are negative.  EKGs/Labs/Other Studies Reviewed:    The following studies were reviewed today: .SABRAEKG Interpretation Date/Time:  Tuesday May 03 2024 08:08:41 EDT Ventricular Rate:  56 PR Interval:  164 QRS Duration:  130 QT Interval:  438 QTC Calculation: 422 R Axis:   98  Text Interpretation: Sinus bradycardia Right bundle branch block Abnormal ECG When compared with ECG of 16-Oct-2023 11:00, Right bundle branch block has replaced Non-specific intra-ventricular conduction block Confirmed by Edwyna Backers [phone]) on 05/03/2024 8:26:55 AM     Recent Labs: No results found for requested labs within last 365 days.  Recent Lipid Panel    Component Value Date/Time   CHOL 224 (H) 01/24/2020 1010   TRIG 119 01/24/2020 1010   HDL 66 01/24/2020 1010   CHOLHDL  3.4 01/24/2020 1010   LDLCALC 137 (H) 01/24/2020 1010    Physical Exam:    VS:  BP (!) 140/72   Pulse (!) 56   Ht 5' 3.6 (1.615 m)   Wt 132 lb 12.8 oz (60.2 kg)   SpO2 96%   BMI 23.08 kg/m     Wt Readings from Last 3 Encounters:  05/03/24 132 lb 12.8 oz (60.2 kg)  10/16/23 136 lb 6.4 oz (61.9 kg)  05/21/23 132 lb 6.4 oz (60.1 kg)     GEN: Patient is in no acute distress HEENT: Normal NECK: No JVD; No carotid bruits LYMPHATICS: No lymphadenopathy CARDIAC: Hear sounds regular, 2/6 systolic murmur at the apex. RESPIRATORY:  Clear to auscultation without rales, wheezing or rhonchi  ABDOMEN: Soft, non-tender, non-distended MUSCULOSKELETAL:  No edema; No deformity  SKIN: Warm and dry NEUROLOGIC:  Alert and oriented x 3 PSYCHIATRIC:  Normal affect   Signed, Backers JONELLE Edwyna, MD  05/03/2024 8:28 AM    [facility]

## 2024-05-29 ENCOUNTER — Other Ambulatory Visit: Payer: Self-pay | Admitting: Cardiology

## 2024-08-08 ENCOUNTER — Other Ambulatory Visit: Payer: Self-pay

## 2024-08-08 DIAGNOSIS — I480 Paroxysmal atrial fibrillation: Secondary | ICD-10-CM

## 2024-08-08 MED ORDER — APIXABAN 5 MG PO TABS: 5.0000 mg | ORAL_TABLET | Freq: Two times a day (BID) | ORAL | Status: DC

## 2024-08-08 NOTE — Telephone Encounter (Signed)
 Sample given

## 2024-08-20 ENCOUNTER — Other Ambulatory Visit: Payer: Self-pay | Admitting: Cardiology

## 2024-11-02 ENCOUNTER — Other Ambulatory Visit: Payer: Self-pay | Admitting: Cardiology

## 2024-11-02 DIAGNOSIS — I480 Paroxysmal atrial fibrillation: Secondary | ICD-10-CM

## 2024-11-02 NOTE — Telephone Encounter (Signed)
 Eliquis  5mg  refill request received. Patient is 89 years old, weight-60.2kg, Crea-0.72 on 10/12/24 via [facility] from [facility], Diagnosis-Afib, and last seen by Dr. Edwyna on 05/03/24. Dose is appropriate based on dosing criteria. Will send in refill to requested pharmacy.
